# Patient Record
Sex: Male | Born: 1935 | Race: White | Hispanic: No | Marital: Married | Smoking: Former smoker
Health system: Southern US, Community
[De-identification: ages and names within clinical notes are randomized; demographics above are authoritative.]

## PROBLEM LIST (undated history)

## (undated) DIAGNOSIS — C801 Malignant (primary) neoplasm, unspecified: Secondary | ICD-10-CM

## (undated) DIAGNOSIS — I10 Essential (primary) hypertension: Secondary | ICD-10-CM

## (undated) HISTORY — PX: CATARACT EXTRACTION: SUR2

## (undated) HISTORY — PX: PROSTATE SURGERY: SHX751

## (undated) HISTORY — PX: RECONSTRUCTION OF EYELID: SHX6576

## (undated) HISTORY — PX: SKIN CANCER EXCISION: SHX779

---

## 2005-11-03 ENCOUNTER — Ambulatory Visit: Payer: Self-pay | Admitting: Specialist

## 2009-11-30 ENCOUNTER — Inpatient Hospital Stay: Payer: Self-pay | Admitting: Internal Medicine

## 2010-03-04 ENCOUNTER — Ambulatory Visit: Payer: Self-pay | Admitting: Internal Medicine

## 2010-03-24 ENCOUNTER — Ambulatory Visit: Payer: Self-pay | Admitting: Pain Medicine

## 2010-03-29 ENCOUNTER — Emergency Department: Payer: Self-pay | Admitting: Emergency Medicine

## 2010-04-06 ENCOUNTER — Ambulatory Visit: Payer: Self-pay | Admitting: Pain Medicine

## 2010-04-19 ENCOUNTER — Ambulatory Visit: Payer: Self-pay | Admitting: Pain Medicine

## 2010-04-22 ENCOUNTER — Ambulatory Visit: Payer: Self-pay | Admitting: Pain Medicine

## 2010-05-18 ENCOUNTER — Ambulatory Visit: Payer: Self-pay | Admitting: Pain Medicine

## 2010-05-31 ENCOUNTER — Ambulatory Visit: Payer: Self-pay | Admitting: Pain Medicine

## 2011-10-24 ENCOUNTER — Ambulatory Visit: Payer: Self-pay | Admitting: Urology

## 2012-01-30 IMAGING — US ABDOMEN ULTRASOUND
1 series · 17 of 25 positions shown · non-contrast
Comparison: none

REASON FOR EXAM: pain, epigastric - eval for biliary disease
COMMENTS:

PROCEDURE:     US  - US ABDOMEN GENERAL SURVEY  - March 29, 2010 [DATE]
RESULT:     Abdominal ultrasound dated 03/29/2010.

[Series 1: abdomen ultrasound · 17 of 110 slices shown]
[im 1/110]
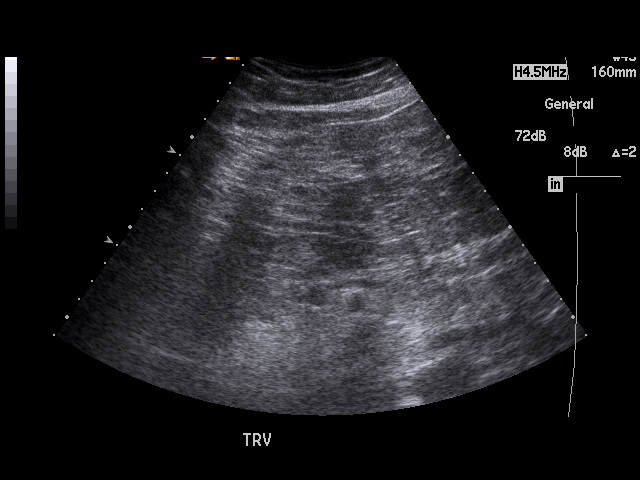
[im 10/110]
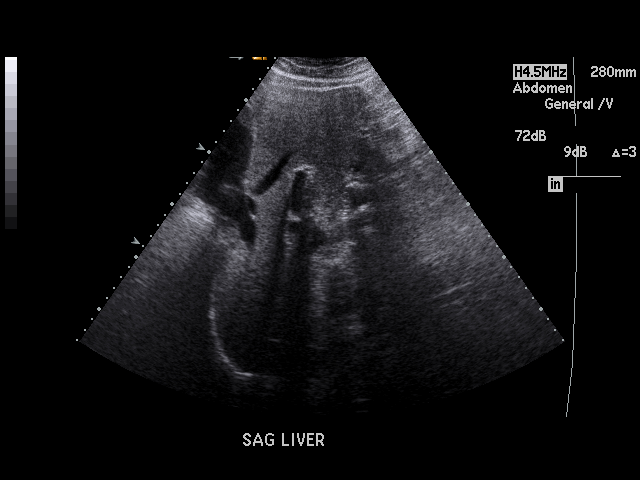
[im 14/110]
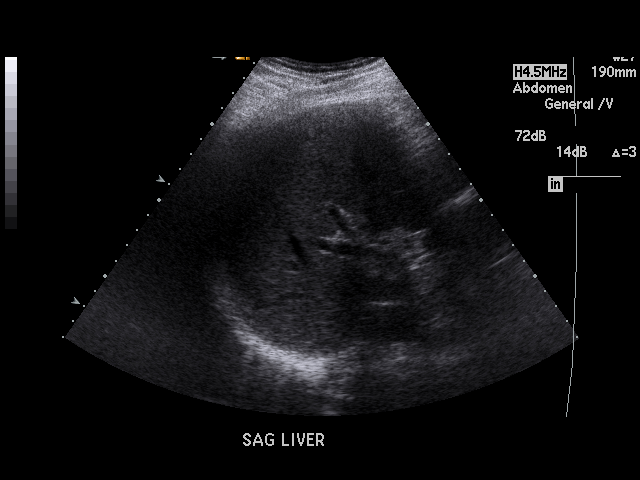
[im 23/110]
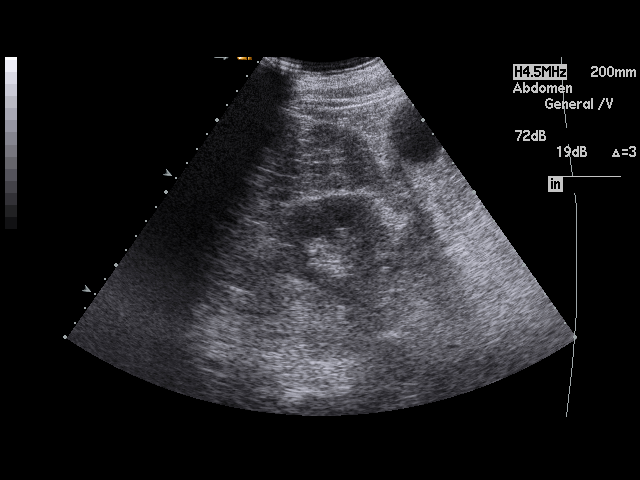
[im 28/110]
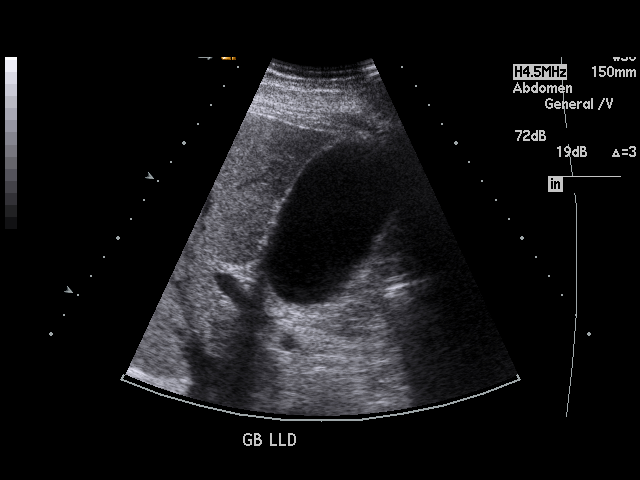
[im 37/110]
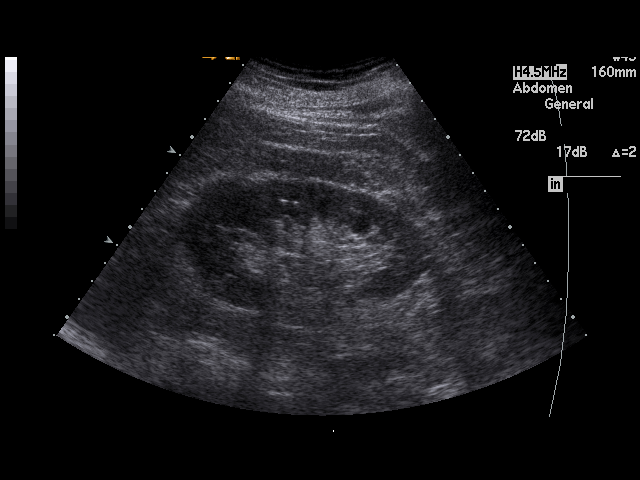
[im 41/110]
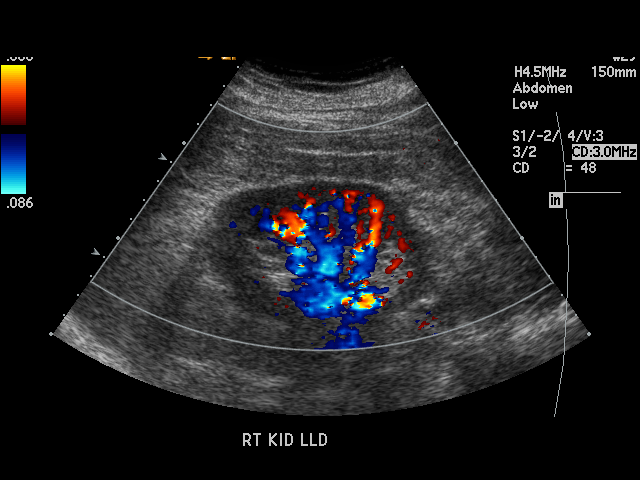
[im 50/110]
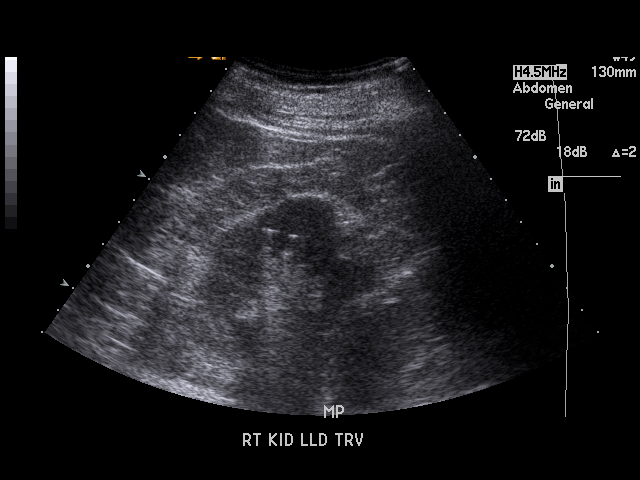
[im 55/110]
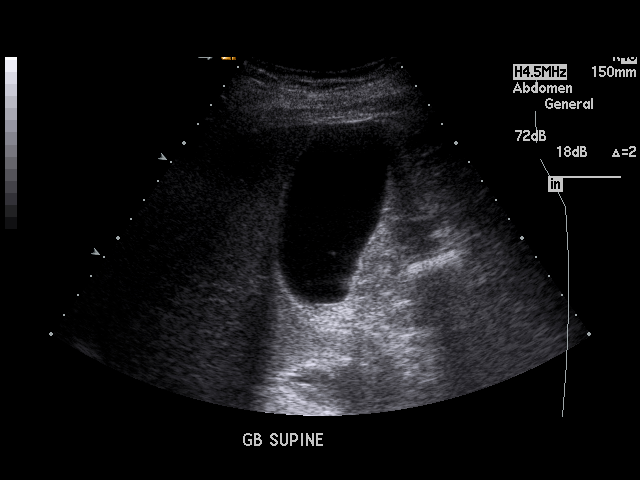
[im 60/110]
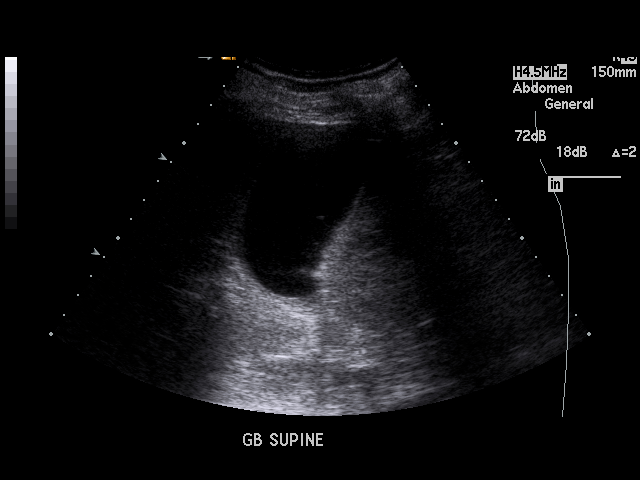
[im 69/110]
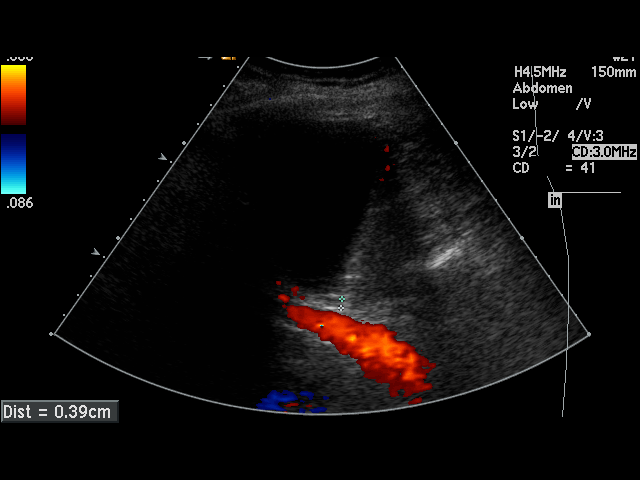
[im 73/110]
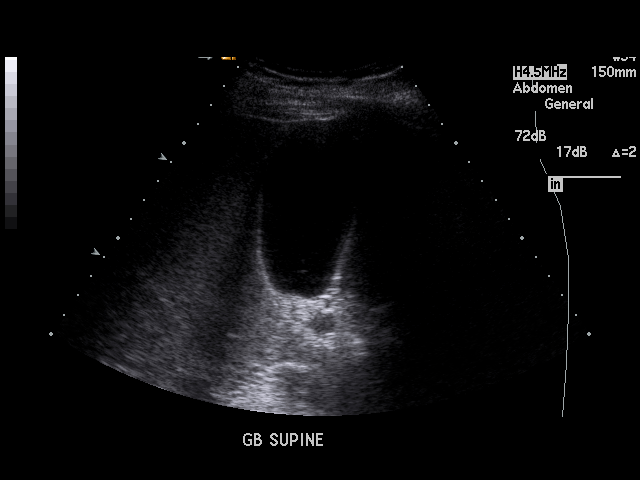
[im 82/110]
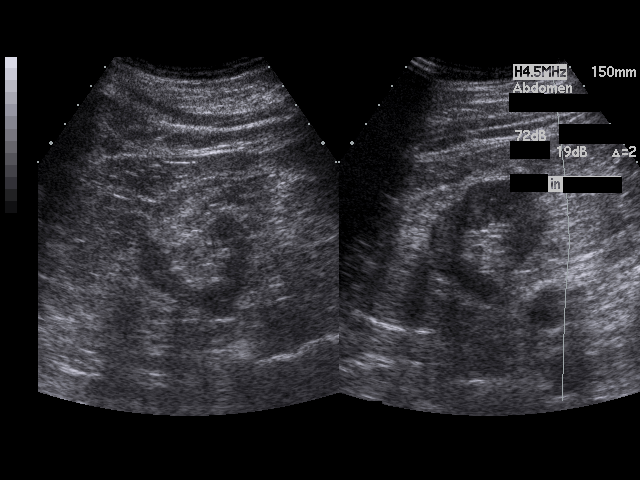
[im 87/110]
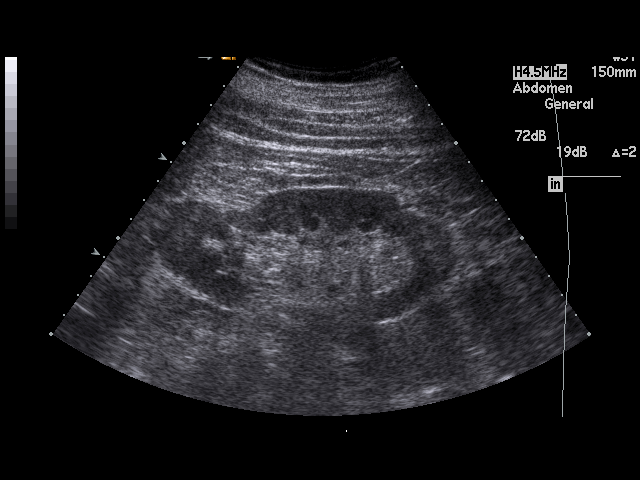
[im 96/110]
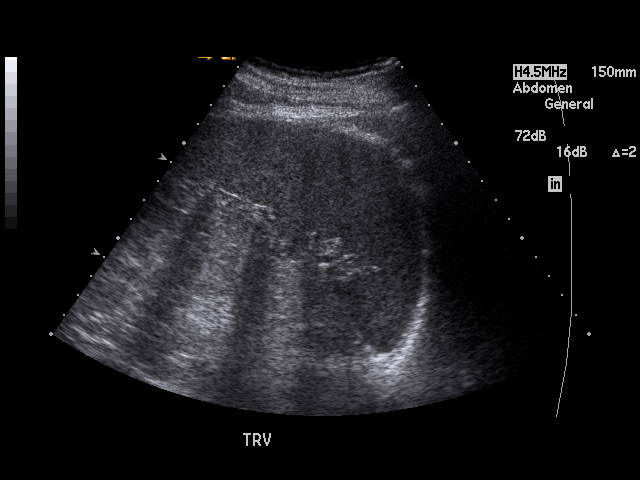
[im 100/110]
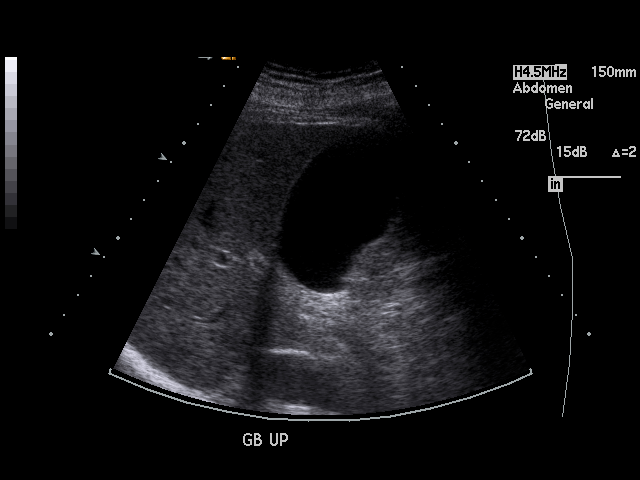
[im 110/110]
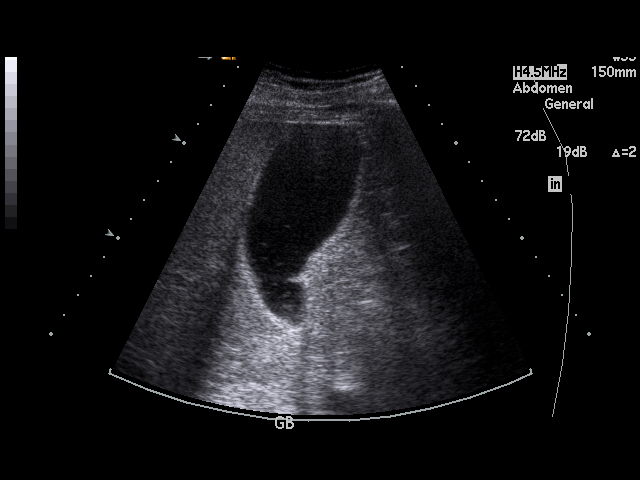

[17 of 25 positions shown; findings below may reference images not displayed]

FINDINGS: The liver demonstrates a homogeneous echotexture. Hepatopedal flow
is identified in the portal vein. Aorta and IVC are unremarkable. The
pancreas is not visualized. Evaluation of the gallbladder demonstrates
findings suspicious for small amount of dependent sludge in the gallbladder.
Otherwise is no evidence of a sonographic Murphy's sign or gallbladder wall
thickness nor pericholecystic fluid. Gallbladder wall measures 2.1 mm in
thickness the common bile that measures 4.3 mm in diameter. Evaluation of
the right kidney measures dimensions of 11.1 x 5.9 x 5.09 cm. There is
appropriate cortical medullary differentiation without evidence of
hydronephrosis masses or calculi. The left kidney measures 12.60 x 5.62 x
5.16 cm. A cortical defect is identified within the upper pole region of the
left kidney possibly representing the sequela of focal pyelonephritis
possibly a focal vascular insult. The spleen demonstrates a homogeneous
echotexture measures 13.3 cm in longitudinal dimensions.
IMPRESSION: Spleen within the upper limits of normal and a cortical
defect along the left kidney otherwise no further focal or acute
abnormalities.

## 2013-06-15 ENCOUNTER — Emergency Department: Payer: Self-pay | Admitting: Internal Medicine

## 2013-06-15 LAB — CBC
HCT: 41.6 % (ref 40.0–52.0)
HGB: 14.9 g/dL (ref 13.0–18.0)
MCH: 33 pg (ref 26.0–34.0)
RBC: 4.51 10*6/uL (ref 4.40–5.90)
WBC: 9.5 10*3/uL (ref 3.8–10.6)

## 2013-06-15 LAB — URINALYSIS, COMPLETE
Bacteria: NONE SEEN
Glucose,UR: NEGATIVE mg/dL (ref 0–75)
Ketone: NEGATIVE
Ph: 7 (ref 4.5–8.0)
Protein: NEGATIVE
Specific Gravity: 1.006 (ref 1.003–1.030)

## 2013-06-15 LAB — COMPREHENSIVE METABOLIC PANEL
Albumin: 3.3 g/dL — ABNORMAL LOW (ref 3.4–5.0)
Alkaline Phosphatase: 90 U/L (ref 50–136)
Anion Gap: 7 (ref 7–16)
BUN: 15 mg/dL (ref 7–18)
Chloride: 108 mmol/L — ABNORMAL HIGH (ref 98–107)
Co2: 25 mmol/L (ref 21–32)
Creatinine: 1.06 mg/dL (ref 0.60–1.30)
Glucose: 145 mg/dL — ABNORMAL HIGH (ref 65–99)
Osmolality: 283 (ref 275–301)
SGOT(AST): 16 U/L (ref 15–37)
SGPT (ALT): 18 U/L (ref 12–78)
Sodium: 140 mmol/L (ref 136–145)
Total Protein: 6.3 g/dL — ABNORMAL LOW (ref 6.4–8.2)

## 2013-07-09 ENCOUNTER — Ambulatory Visit: Payer: Self-pay | Admitting: Urology

## 2013-07-09 DIAGNOSIS — I2510 Atherosclerotic heart disease of native coronary artery without angina pectoris: Secondary | ICD-10-CM

## 2013-07-09 LAB — CBC WITH DIFFERENTIAL/PLATELET
Basophil #: 0 10*3/uL (ref 0.0–0.1)
Eosinophil #: 0.1 10*3/uL (ref 0.0–0.7)
HCT: 41.1 % (ref 40.0–52.0)
Lymphocyte #: 1.7 10*3/uL (ref 1.0–3.6)
Lymphocyte %: 20.8 %
MCHC: 34.2 g/dL (ref 32.0–36.0)
MCV: 92 fL (ref 80–100)
Monocyte #: 0.6 x10 3/mm (ref 0.2–1.0)
Neutrophil #: 5.5 10*3/uL (ref 1.4–6.5)
RBC: 4.46 10*6/uL (ref 4.40–5.90)
RDW: 13.2 % (ref 11.5–14.5)
WBC: 7.9 10*3/uL (ref 3.8–10.6)

## 2013-07-16 ENCOUNTER — Ambulatory Visit: Payer: Self-pay | Admitting: Urology

## 2013-07-17 LAB — CBC WITH DIFFERENTIAL/PLATELET
Basophil #: 0 10*3/uL (ref 0.0–0.1)
Basophil %: 0.2 %
Eosinophil #: 0.1 10*3/uL (ref 0.0–0.7)
Eosinophil %: 1.6 %
HCT: 36.8 % — ABNORMAL LOW (ref 40.0–52.0)
HGB: 12.7 g/dL — ABNORMAL LOW (ref 13.0–18.0)
MCH: 31.6 pg (ref 26.0–34.0)
MCHC: 34.4 g/dL (ref 32.0–36.0)
Monocyte #: 0.7 x10 3/mm (ref 0.2–1.0)
Monocyte %: 8.3 %
Neutrophil #: 6 10*3/uL (ref 1.4–6.5)
Neutrophil %: 74.4 %
RBC: 4.01 10*6/uL — ABNORMAL LOW (ref 4.40–5.90)
RDW: 13.2 % (ref 11.5–14.5)
WBC: 8.1 10*3/uL (ref 3.8–10.6)

## 2013-07-17 LAB — BASIC METABOLIC PANEL
Anion Gap: 4 — ABNORMAL LOW (ref 7–16)
BUN: 18 mg/dL (ref 7–18)
Calcium, Total: 8.5 mg/dL (ref 8.5–10.1)
Co2: 27 mmol/L (ref 21–32)
EGFR (African American): >60
Potassium: 3.9 mmol/L (ref 3.5–5.1)
Sodium: 140 mmol/L (ref 136–145)

## 2013-08-26 IMAGING — CR DG ABDOMEN 1V
1 series · 1 of 1 positions shown · non-contrast
Comparison: none

REASON FOR EXAM: nephrolihiasis Renal Colic
COMMENTS:

[supine ap]
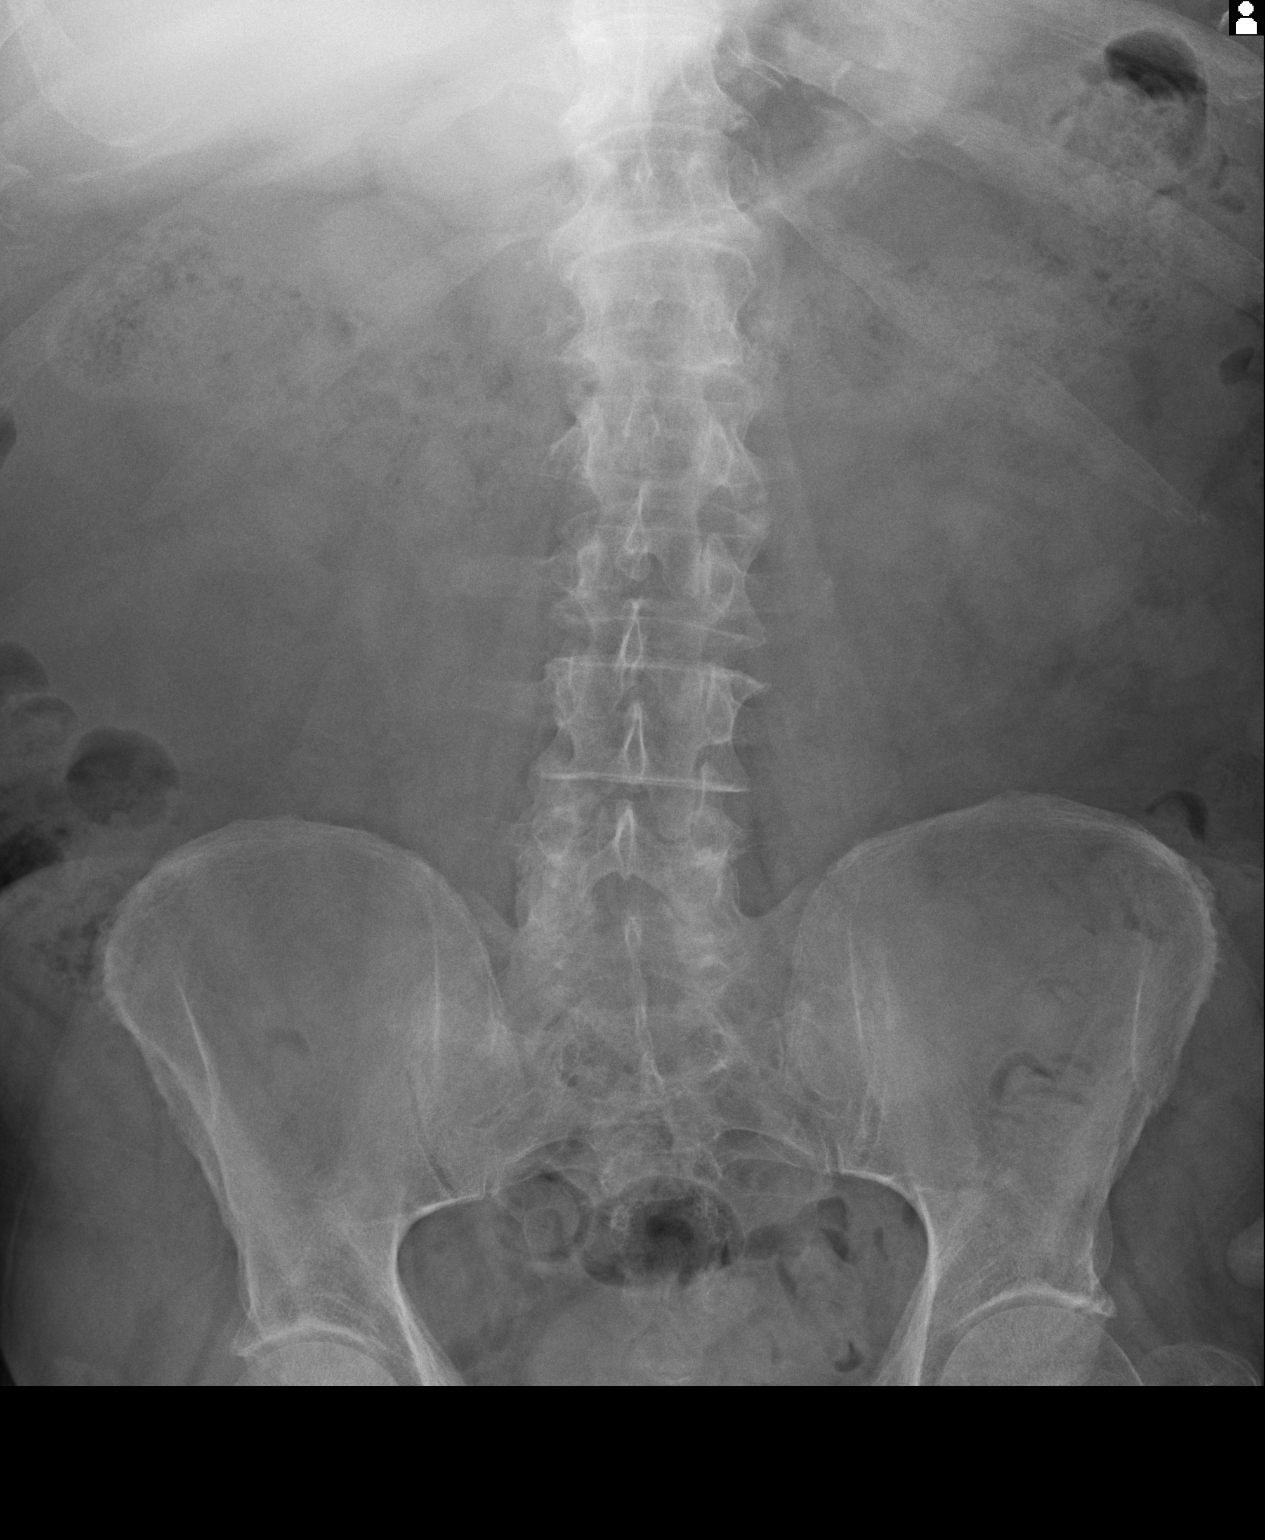

[1 of 1 positions shown; findings below may reference images not displayed]

PROCEDURE:     DXR - DXR KIDNEY URETER BLADDER  - October 24, 2011 [DATE]

RESULT:

Frontal view of the abdomen and pelvis is performed.

Air is seen within nondilated loops of large and small bowel. A moderate to
large amount of stool is appreciated within the colon. Evaluation of the
right and left renal fossa regions demonstrates no appreciable evidence of
calculi. The visualized bony skeleton demonstrates no evidence of fracture
or dislocation.
IMPRESSION: Nonobstructive bowel gas pattern with a moderate amount of
stool.

## 2014-04-10 DIAGNOSIS — I10 Essential (primary) hypertension: Secondary | ICD-10-CM | POA: Insufficient documentation

## 2014-04-10 DIAGNOSIS — F411 Generalized anxiety disorder: Secondary | ICD-10-CM | POA: Insufficient documentation

## 2014-04-10 DIAGNOSIS — D649 Anemia, unspecified: Secondary | ICD-10-CM | POA: Insufficient documentation

## 2014-12-12 NOTE — Op Note (Signed)
PATIENT NAME:  Don Arias, Don Arias MR#:  [id number] DATE OF BIRTH:  1929-09-19  DATE OF PROCEDURE:  07/16/2013  PREOPERATIVE DIAGNOSES: Benign prostatic hypertrophy with median lobe formation and urinary retention.   POSTOPERATIVE DIAGNOSES: Benign prostatic hypertrophy with median lobe formation and urinary retention.   PROCEDURE: Transurethral resection of the prostate.   SURGEON: Edrick Oh, M.D.   ANESTHESIA: Laryngeal mask airway anesthesia.   INDICATIONS: The patient is an 81 year old gentleman with a history of prostatic hypertrophy. He recently developed urinary retention, failing voiding trials and medical management. Cystoscopy demonstrated a prominent median lobe with intravesical protrusion and prominent bilobar hypertrophy. He presents for transurethral resection of the prostate.   DESCRIPTION OF PROCEDURE: After informed consent was obtained, the patient was taken to the operating room and placed in the dorsal lithotomy position under laryngeal mask airway anesthesia. The patient was then prepped and draped in the usual standard fashion. The saline resectoscope sheath was placed through the penis under direct vision with no significant abnormalities appreciated. Upon entering the prostatic fossa, prominent bilobar hypertrophy was noted. The verumontanum was easily identified. Complete visual obstruction was appreciated. A fairly high bladder neck was also appreciated with a prominent median lobe with intravesical protrusion. Bilateral ureteral orifices were easily identified. Some inflammatory changes were noted on the posterior bladder wall consistent with Foley catheterization. No other significant abnormalities were noted. The saline resectoscope was then placed through the sheath. Resection was initially begun at the level of the bladder neck at the 6 o'clock position back to the level of the verumontanum. The bulk of the median lobe tissue was not resected at this time. Once a channel  was developed, the bladder neck was taken down to the level of the trigone. This included a significant portion of the median lobe tissue. Additional resection was then undertaken bilaterally of the remaining median lobe tissue. Additional lateral lobe tissue was taken down circumferentially with some noted anteriorly. This was also taken down utilizing the loop. Additional lateral lobe tissue was then taken down back to the level of the verumontanum. Once again, some anterior tissue was present even at this level. This was also resected. Once the bulk of the resection was complete, the button electrode was then utilized to facilitate additional tissue removal. There were several areas of additional tissue noted predominantly on the left. The loop was replaced. Additional tissue was resected as well as some apical tissue that was once again appreciated. Once the residual tissue was resected, the button electrode was once again utilized to facilitate surfacing of the prostate with complete fulguration of any significant bleeders. A wide open bladder neck was appreciated. Bilateral ureteral orifices were easily appreciated at the end of the procedure with no abnormalities noted. The prostate chips were irrigated free from the bladder. They were collected and will be sent for pathology analysis. The resectoscope was removed. A 22-French 3-way Foley catheter was placed utilizing the catheter guide without difficulty. The catheter was placed to gravity drainage. It was also placed to continuous bladder irrigation. The patient was returned to the supine position. He was awakened from laryngeal mask airway anesthesia. Was taken to the recovery room in stable condition. There were no problems or complications. The patient tolerated the procedure well.   ESTIMATED BLOOD LOSS: Minimal.   ____________________________ Denice Bors. Jacqlyn Larsen, MD bsc:gb D: 07/17/2013 22:15:48 ET T: 07/17/2013 22:57:21 ET JOB#: [id number]  cc: Denice Bors. Jacqlyn Larsen, MD, <Dictator> Denice Bors  MD ELECTRONICALLY SIGNED 07/22/2013 9:14

## 2014-12-12 NOTE — Discharge Summary (Signed)
PATIENT NAME:  Don Arias, Don Arias MR#:  [id number] DATE OF BIRTH:  November 02, 1929  DATE OF ADMISSION:  07/16/2013 DATE OF DISCHARGE:  07/17/2013  PRINCIPAL DIAGNOSES: Benign prostatic hypertrophy with median lobe formation, urinary retention.   PROCEDURE: Transurethral resection of the prostate.   INDICATIONS: The patient is an 81 year old gentleman with a history of prostatic hypertrophy. He recently developed urinary retention, failing voiding trials and medical management. Cystoscopy demonstrated prominent bilobar prostatic hypertrophy with prominent median lobe formation. He presents for transurethral resection of the prostate.   HOSPITAL COURSE: Don Arias was taken to the operating room on 07/16/2013, at which time he underwent transurethral resection of the prostate utilizing the saline resectoscope. A significant amount of tissue was resected. No significant bleeding was encountered. The patient's postoperative course was without significant problems or complications. He was maintained on continuous bladder irrigation until the morning of postoperative day 1. The irrigation remained only blood-tinged throughout his hospitalization. No significant clots were encountered. The patient had minimal pain and discomfort. There was no significant nausea or vomiting. He was tolerating a general diet without difficulty. He remained afebrile, vital signs stable. Blood work the morning postprocedure demonstrated no significant hemoglobin drop with good renal function. Magnesium levels were also within acceptable limits. The Foley catheter and continuous bladder irrigation was removed on the early morning of postoperative day 1. The patient was noted to void 3 times. Prior to discharge, his residual was minimal at approximately 18 mL, only mild blood-tinged urine was appreciated. The patient continued to do well and was ambulating without difficulty. He was subsequently discharged to home on 07/17/2013, with  instructions to follow up as scheduled. He was discharged on his usual admission medications, in addition to Cipro 500 mg twice daily for a 7 day course and hydrocodone 5/325 mg 1 every 6 hours as needed for pain. He is to avoid any nonsteroidal pain medications or significant physical activity for at least the next 2 weeks. He is to notify us if there are any further problems or questions in the interim.     ____________________________ Denice Bors Jacqlyn Larsen, MD bsc:dmm D: 07/17/2013 22:18:41 ET T: 07/17/2013 22:55:34 ET JOB#: [id number]  cc: Denice Bors. Jacqlyn Larsen, MD, <Dictator> Denice Bors  MD ELECTRONICALLY SIGNED 07/22/2013 9:14

## 2021-04-16 ENCOUNTER — Encounter: Payer: Self-pay | Admitting: Emergency Medicine

## 2021-04-16 ENCOUNTER — Other Ambulatory Visit: Payer: Self-pay

## 2021-04-16 ENCOUNTER — Emergency Department
Admission: EM | Admit: 2021-04-16 | Discharge: 2021-04-16 | Disposition: A | Discharge status: Home / Self Care | Payer: Medicare Other | Attending: Emergency Medicine | Admitting: Emergency Medicine

## 2021-04-16 DIAGNOSIS — K219 Gastro-esophageal reflux disease without esophagitis: Secondary | ICD-10-CM

## 2021-04-16 DIAGNOSIS — F29 Unspecified psychosis not due to a substance or known physiological condition: Secondary | ICD-10-CM | POA: Insufficient documentation

## 2021-04-16 DIAGNOSIS — F4321 Adjustment disorder with depressed mood: Secondary | ICD-10-CM

## 2021-04-16 DIAGNOSIS — F4320 Adjustment disorder, unspecified: Secondary | ICD-10-CM | POA: Insufficient documentation

## 2021-04-16 DIAGNOSIS — F410 Panic disorder [episodic paroxysmal anxiety] without agoraphobia: Secondary | ICD-10-CM | POA: Diagnosis not present

## 2021-04-16 DIAGNOSIS — I10 Essential (primary) hypertension: Secondary | ICD-10-CM | POA: Insufficient documentation

## 2021-04-16 DIAGNOSIS — Y900 Blood alcohol level of less than 20 mg/100 ml: Secondary | ICD-10-CM | POA: Insufficient documentation

## 2021-04-16 DIAGNOSIS — Z85828 Personal history of other malignant neoplasm of skin: Secondary | ICD-10-CM | POA: Insufficient documentation

## 2021-04-16 DIAGNOSIS — Z87891 Personal history of nicotine dependence: Secondary | ICD-10-CM | POA: Insufficient documentation

## 2021-04-16 DIAGNOSIS — Z79899 Other long term (current) drug therapy: Secondary | ICD-10-CM | POA: Insufficient documentation

## 2021-04-16 HISTORY — DX: Essential (primary) hypertension: I10

## 2021-04-16 HISTORY — DX: Malignant (primary) neoplasm, unspecified: C80.1

## 2021-04-16 LAB — COMPREHENSIVE METABOLIC PANEL
ALT: 16 U/L (ref 0–44)
AST: 20 U/L (ref 15–41)
Albumin: 3.8 g/dL (ref 3.5–5.0)
Alkaline Phosphatase: 65 U/L (ref 38–126)
Anion gap: 5 (ref 5–15)
BUN: 23 mg/dL (ref 8–23)
CO2: 27 mmol/L (ref 22–32)
Calcium: 9.3 mg/dL (ref 8.9–10.3)
Chloride: 108 mmol/L (ref 98–111)
Creatinine, Ser: 0.93 mg/dL (ref 0.61–1.24)
GFR, Estimated: >60 mL/min (ref >60)
Glucose, Bld: 111 mg/dL — ABNORMAL HIGH (ref 70–99)
Potassium: 3.8 mmol/L (ref 3.5–5.1)
Sodium: 140 mmol/L (ref 135–145)
Total Bilirubin: 0.7 mg/dL (ref 0.3–1.2)
Total Protein: 6.4 g/dL — ABNORMAL LOW (ref 6.5–8.1)

## 2021-04-16 LAB — CBC
HCT: 44.2 % (ref 39.0–52.0)
Hemoglobin: 14.8 g/dL (ref 13.0–17.0)
MCH: 32.8 pg (ref 26.0–34.0)
MCHC: 33.5 g/dL (ref 30.0–36.0)
MCV: 98 fL (ref 80.0–100.0)
Platelets: 183 10*3/uL (ref 150–400)
RBC: 4.51 MIL/uL (ref 4.22–5.81)
RDW: 12.8 % (ref 11.5–15.5)
WBC: 7.5 10*3/uL (ref 4.0–10.5)
nRBC: 0 % (ref 0.0–0.2)

## 2021-04-16 LAB — ETHANOL: Alcohol, Ethyl (B): <10 mg/dL (ref <10)

## 2021-04-16 MED ORDER — FLUOXETINE HCL 20 MG PO CAPS: 20.0000 mg | ORAL_CAPSULE | Freq: Every day | ORAL | 1 refills | Status: DC

## 2021-04-16 MED ORDER — FLUOXETINE HCL 20 MG PO CAPS: 20.0000 mg | ORAL_CAPSULE | Freq: Every day | ORAL | Status: DC

## 2021-04-16 MED ORDER — CLONAZEPAM 0.5 MG PO TABS: 0.5000 mg | ORAL_TABLET | Freq: Every day | ORAL | 0 refills | Status: DC

## 2021-04-16 MED ORDER — PANTOPRAZOLE SODIUM 40 MG PO TBEC: 40.0000 mg | DELAYED_RELEASE_TABLET | Freq: Every day | ORAL | 1 refills | Status: DC

## 2021-04-16 MED ORDER — CLONAZEPAM 0.5 MG PO TABS: 0.5000 mg | ORAL_TABLET | Freq: Every day | ORAL | Status: DC

## 2021-04-16 MED ORDER — PANTOPRAZOLE SODIUM 40 MG PO TBEC: 40.0000 mg | DELAYED_RELEASE_TABLET | Freq: Every day | ORAL | Status: DC

## 2021-04-16 NOTE — ED Triage Notes (Signed)
Pt comes into the ED via POV with his daughter c/o suicidal ideation and need for psych consult.  Pt states that he lost his wife about a month ago and since then he has been having increased stress and anxiety.  PT states about 2 days ago he thought he had a panic attack and since then he has made comments to his children that he just wanted to end things.  Pt did inform children that he had nothing in the home to complete any suicidal thoughts.  Pt currently is denying SI or HI.  Pt denies any history of depression either.

## 2021-04-16 NOTE — ED Notes (Signed)
Pt given lunch tray.

## 2021-04-16 NOTE — Consult Note (Signed)
[facility] Face-to-Face Psychiatry Consult   Reason for Consult: Consult for 87 year old man who came voluntarily to the emergency room because of new onset anxiety and dysphoria Referring Physician: Corky Downs Patient Identification: Don Arias MRN:  [id number] Principal Diagnosis: Panic disorder Diagnosis:  Principal Problem:   Panic disorder Active Problems:   GERD (gastroesophageal reflux disease)   Grief   Total Time spent with patient: 1 hour  Subjective:   Don Arias is a 87 y.o. male patient admitted with "my nights have been bad".  HPI: Patient seen chart reviewed.  87 year old man came to the hospital accompanied by his daughter.  Patient's wife died several weeks ago.  In the last couple weeks he has developed anxiety symptoms that have become very severe at nighttime primarily.  He reports that during the night he will have spells where his heart will pound, he feels short of breath, he will feel like his stomach is tying in knots and have severe heartburn symptoms and at times will feel so overwhelmed that he thinks he is going to die.  The daytime is better although he has been anxious and dysphoric during the day.  Has called his children several times to talk about how bad the symptoms are.  On one occasion his children were under the impression that he was talking about having suicidal thoughts.  In interview today the patient tells me that he was being completely hypothetical and at no time actually had any intention or plan of harming himself.  He is still getting up and taking care of himself during the day.  Eating adequately.  Interacting with family.  No evidence of psychotic symptoms.  Patient does not drink at all or use any drugs and is not on any prescription medicine.  No evidence of any significant dementia  Past Psychiatric History: Patient reports a spell some years ago when he was briefly prescribed Xanax by his primary care doctor.  He says he took this for a  short period of time and tapered off of it.  Found it helpful but was easily able to stop it.  No other psychiatric medicine known.  No history of suicide attempts or violence.  Risk to Self:   Risk to Others:   Prior Inpatient Therapy:   Prior Outpatient Therapy:    Past Medical History:  Past Medical History:  Diagnosis Date   Cancer ([place])    skin   Hypertension     Past Surgical History:  Procedure Laterality Date   CATARACT EXTRACTION     PROSTATE SURGERY     RECONSTRUCTION OF EYELID     SKIN CANCER EXCISION     Family History: History reviewed. No pertinent family history. Family Psychiatric  History: None reported Social History:  Social History   Substance and Sexual Activity  Alcohol Use Not Currently     Social History   Substance and Sexual Activity  Drug Use Never    Social History   Socioeconomic History   Marital status: Married    Spouse name: Not on file   Number of children: Not on file   Years of education: Not on file   Highest education level: Not on file  Occupational History   Not on file  Tobacco Use   Smoking status: Former    Types: Cigarettes   Smokeless tobacco: Former  Substance and Sexual Activity   Alcohol use: Not Currently   Drug use: Never   Sexual activity: Not on file  Other Topics Concern   Not on file  Social History Narrative   Not on file   Social Determinants of Health   Financial Resource Strain: Not on file  Food Insecurity: Not on file  Transportation Needs: Not on file  Physical Activity: Not on file  Stress: Not on file  Social Connections: Not on file   Additional Social History:    Allergies:   Allergies  Allergen Reactions   Nitroglycerin Other (See Comments)    Cardiac arrest    Labs:  Results for orders placed or performed during the hospital encounter of 04/16/21 (from the past 48 hour(s))  Comprehensive metabolic panel     Status: Abnormal   Collection Time: 04/16/21 11:41 AM  Result  Value Ref Range   Sodium 140 135 - 145 mmol/L   Potassium 3.8 3.5 - 5.1 mmol/L   Chloride 108 98 - 111 mmol/L   CO2 27 22 - 32 mmol/L   Glucose, Bld 111 (H) 70 - 99 mg/dL    Comment: Glucose reference range applies only to samples taken after fasting for at least 8 hours.   BUN 23 8 - 23 mg/dL   Creatinine, Ser 0.93 0.61 - 1.24 mg/dL   Calcium 9.3 8.9 - 10.3 mg/dL   Total Protein 6.4 (L) 6.5 - 8.1 g/dL   Albumin 3.8 3.5 - 5.0 g/dL   AST 20 15 - 41 U/L   ALT 16 0 - 44 U/L   Alkaline Phosphatase 65 38 - 126 U/L   Total Bilirubin 0.7 0.3 - 1.2 mg/dL   GFR, Estimated >60 >60 mL/min    Comment: (NOTE) Calculated using the CKD-EPI Creatinine Equation (2021)    Anion gap 5 5 - 15    Comment: Performed at [facility], [address] [place], [place] [address]  Ethanol     Status: None   Collection Time: 04/16/21 11:41 AM  Result Value Ref Range   Alcohol, Ethyl (B) <10 <10 mg/dL    Comment: (NOTE) Lowest detectable limit for serum alcohol is 10 mg/dL.  For medical purposes only. Performed at [facility], [address] [place], [place] [address]   cbc     Status: None   Collection Time: 04/16/21 11:41 AM  Result Value Ref Range   WBC 7.5 4.0 - 10.5 K/uL   RBC 4.51 4.22 - 5.81 MIL/uL   Hemoglobin 14.8 13.0 - 17.0 g/dL   HCT 44.2 39.0 - 52.0 %   MCV 98.0 80.0 - 100.0 fL   MCH 32.8 26.0 - 34.0 pg   MCHC 33.5 30.0 - 36.0 g/dL   RDW 12.8 11.5 - 15.5 %   Platelets 183 150 - 400 K/uL   nRBC 0.0 0.0 - 0.2 %    Comment: Performed at [facility], [address] [place], [place] [address]    Current Facility-Administered Medications  Medication Dose Route Frequency Provider Last Rate Last Admin   clonazePAM (KLONOPIN) tablet 0.5 mg  0.5 mg Oral QHS , Madie Reno, MD       Derrill Memo ON 04/17/2021] FLUoxetine (PROZAC) capsule 20 mg  20 mg Oral Daily , Madie Reno, MD       Derrill Memo ON 04/17/2021] pantoprazole (PROTONIX) EC tablet 40 mg  40 mg Oral  Daily , Madie Reno, MD       Current Outpatient Medications  Medication Sig Dispense Refill   clonazePAM (KLONOPIN) 0.5 MG tablet Take 1 tablet (0.5 mg total) by mouth at bedtime.  30 tablet 0   [START ON 04/17/2021] FLUoxetine (PROZAC) 20 MG capsule Take 1 capsule (20 mg total) by mouth daily. 30 capsule 1   [START ON 04/17/2021] pantoprazole (PROTONIX) 40 MG tablet Take 1 tablet (40 mg total) by mouth daily. 30 tablet 1    Musculoskeletal: Strength & Muscle Tone: within normal limits Gait & Station: normal Patient leans: N/A            Psychiatric Specialty Exam:  Presentation  General Appearance:  No data recorded Eye Contact: No data recorded Speech: No data recorded Speech Volume: No data recorded Handedness: No data recorded  Mood and Affect  Mood: No data recorded Affect: No data recorded  Thought Process  Thought Processes: No data recorded Descriptions of Associations:No data recorded Orientation:No data recorded Thought Content:No data recorded History of Schizophrenia/Schizoaffective disorder:No data recorded Duration of Psychotic Symptoms:No data recorded Hallucinations:No data recorded Ideas of Reference:No data recorded Suicidal Thoughts:No data recorded Homicidal Thoughts:No data recorded  Sensorium  Memory: No data recorded Judgment: No data recorded Insight: No data recorded  Executive Functions  Concentration: No data recorded Attention Span: No data recorded Recall: No data recorded Fund of Knowledge: No data recorded Language: No data recorded  Psychomotor Activity  Psychomotor Activity: No data recorded  Assets  Assets: No data recorded  Sleep  Sleep: No data recorded  Physical Exam: Physical Exam Vitals and nursing note reviewed.  Constitutional:      Appearance: Normal appearance.  HENT:     Head: Normocephalic and atraumatic.     Mouth/Throat:     Pharynx: Oropharynx is clear.  Eyes:     Pupils:  Pupils are equal, round, and reactive to light.  Cardiovascular:     Rate and Rhythm: Normal rate and regular rhythm.  Pulmonary:     Effort: Pulmonary effort is normal.     Breath sounds: Normal breath sounds.  Abdominal:     General: Abdomen is flat.     Palpations: Abdomen is soft.  Musculoskeletal:        General: Normal range of motion.  Skin:    General: Skin is warm and dry.  Neurological:     General: No focal deficit present.     Mental Status: He is alert. Mental status is at baseline.  Psychiatric:        Attention and Perception: Attention normal.        Mood and Affect: Mood is anxious.        Speech: Speech normal.        Behavior: Behavior normal.        Thought Content: Thought content normal.        Cognition and Memory: Cognition normal.        Judgment: Judgment normal.   Review of Systems  Constitutional: Negative.   HENT: Negative.    Eyes: Negative.   Respiratory: Negative.    Cardiovascular: Negative.   Gastrointestinal:  Positive for heartburn.  Musculoskeletal: Negative.   Skin: Negative.   Neurological: Negative.   Psychiatric/Behavioral:  Negative for hallucinations, substance abuse and suicidal ideas. The patient is nervous/anxious and has insomnia.   Blood pressure (!) 183/103, pulse 71, temperature 98 F (36.7 C), temperature source Oral, resp. rate 19, height '5\' 9"'$  (1.753 m), weight 83.9 kg, SpO2 98 %. Body mass index is 27.32 kg/m.  Treatment Plan Summary: Medication management and Plan 87 year old gentleman who presents with new onset panic symptoms occurring primarily at night disrupting his sleep.  Sadness  and dysphoria during the day perhaps somewhat more than would be expected for this time of grief but not necessarily so.  Does not appear to have a full complement of major depression symptoms.  Patient seems to have pretty significant gastric reflux symptoms even at baseline and those symptoms are one of the worst aspects of his current  panic attacks.  Psychoeducation completed about the treatment of this illness.  Patient encouraged to follow through with treatment.  Recommend starting fluoxetine 20 mg/day.  Normal full-strength adult dose despite his age as the patient seems to be in very good health.  Explained that this will take some time to work.  Meantime clonazepam 0.5 mg at bedtime for the anxiety symptoms.  Only 1 month given.  Acknowledged to the patient that it can be habit forming but encouraged him to take it for now and follow up with his primary provider.  Also pantoprazole extended release 40 mg a day for the gastric reflux.  Patient is a English as a second language teacher and has been instructed to follow-up with his primary care doctor there and seek referral to mental health at the [place].  Case reviewed with emergency room physician.  Patient and daughter are satisfied.  Patient does not appear to require inpatient hospitalization or commitment.  Disposition: No evidence of imminent risk to self or others at present.   Patient does not meet criteria for psychiatric inpatient admission. Supportive therapy provided about ongoing stressors. Discussed crisis plan, support from social network, calling 911, coming to the Emergency Department, and calling Suicide Hotline.  Alethia Berthold, MD 04/16/2021 2:57 PM

## 2021-04-16 NOTE — ED Notes (Signed)
Pt BP 190/110. Repeat BP 205/80. MD made aware

## 2021-04-16 NOTE — ED Notes (Signed)
Dr. Weber Cooks speaking with pt and daughter

## 2021-04-16 NOTE — ED Notes (Signed)
Urine sample collected and sent to lab by this Probation officer.

## 2021-04-16 NOTE — ED Notes (Signed)
Pts daughter at bedside  

## 2021-04-16 NOTE — ED Provider Notes (Signed)
[facility] Emergency Department Provider Note   ____________________________________________    I have reviewed the triage vital signs and the nursing notes.   HISTORY  Chief Complaint Psychiatric Evaluation     HPI Don Arias is a 87 y.o. male who presents for depression.  Patient reports his wife died recently and he is struggling with depression.  Daughter reports that patient has made suicidal comments.  He has nothing at his house that he can harm himself with reportedly but they note that he becomes very anxious and has difficulty sleeping and are requesting help with this.  Patient has no physical complaints at this time  Past Medical History:  Diagnosis Date   Cancer ([place])    skin   Hypertension     There are no problems to display for this patient.   Past Surgical History:  Procedure Laterality Date   CATARACT EXTRACTION     PROSTATE SURGERY     RECONSTRUCTION OF EYELID     SKIN CANCER EXCISION      Prior to Admission medications   Not on File     Allergies Nitroglycerin  History reviewed. No pertinent family history.  Social History Social History   Tobacco Use   Smoking status: Former    Types: Cigarettes   Smokeless tobacco: Former  Substance Use Topics   Alcohol use: Not Currently   Drug use: Never    Review of Systems  Constitutional: No fever/chills Eyes: No visual changes.  ENT: No sore throat. Cardiovascular: Denies chest pain. Respiratory: Denies shortness of breath. Gastrointestinal: No abdominal pain.   Genitourinary: Negative for dysuria. Musculoskeletal: Negative for back pain. Skin: Negative for rash. Neurological: Negative for headaches    ____________________________________________   PHYSICAL EXAM:  VITAL SIGNS: ED Triage Vitals  Enc Vitals Group     BP 04/16/21 1133 (!) 183/103     Pulse Rate 04/16/21 1133 71     Resp 04/16/21 1133 19     Temp 04/16/21 1133 98 F (36.7  C)     Temp Source 04/16/21 1133 Oral     SpO2 04/16/21 1133 98 %     Weight 04/16/21 1134 83.9 kg (185 lb)     Height 04/16/21 1134 1.753 m ('5\' 9"'$ )     Head Circumference --      Peak Flow --      Pain Score 04/16/21 1134 2     Pain Loc --      Pain Edu? --      Excl. in [place]? --     Constitutional: Alert and oriented.  Eyes: Conjunctivae are normal.   Nose: No congestion/rhinnorhea. Mouth/Throat: Mucous membranes are moist.   Neck:  Painless ROM Cardiovascular: Normal rate, regular rhythm.  Good peripheral circulation. Respiratory: Normal respiratory effort.  No retractions.   Genitourinary: deferred Musculoskeletal: No lower extremity tenderness nor edema.  Warm and well perfused Neurologic:  Normal speech and language. No gross focal neurologic deficits are appreciated.  Skin:  Skin is warm, dry and intact.  Psychiatric: Stable mood, tearful at times when describing his wife speech normal  ____________________________________________   LABS (all labs ordered are listed, but only abnormal results are displayed)  Labs Reviewed  ETHANOL  CBC  COMPREHENSIVE METABOLIC PANEL   ____________________________________________  EKG  None ____________________________________________  RADIOLOGY  None ____________________________________________   PROCEDURES  Procedure(s) performed: No  Procedures   Critical Care performed: No ____________________________________________   INITIAL IMPRESSION / ASSESSMENT AND  PLAN / ED COURSE  Pertinent labs & imaging results that were available during my care of the patient were reviewed by me and considered in my medical decision making (see chart for details).   Patient well-appearing and in no acute distress.  Have consulted psychiatry, patient will be seen by Dr. Weber Cooks ----------------------------------------- 2:48 PM on 04/16/2021 -----------------------------------------  Seen by Dr. Weber Cooks and cleared for  discharge    ____________________________________________   FINAL CLINICAL IMPRESSION(S) / ED DIAGNOSES  Final diagnoses:  Grief reaction        Note:  This document was prepared using Dragon voice recognition software and may include unintentional dictation errors.    Lavonia Drafts, MD 04/16/21 [phone]

## 2021-04-16 NOTE — ED Notes (Signed)
Pt given graham crackers.

## 2021-04-16 NOTE — ED Notes (Addendum)
Pt belongings:  Nyoka Cowden shirt [employer] ring (unable to get off patient at this time) [employer] boxers

## 2023-03-18 ENCOUNTER — Emergency Department: Payer: Medicare Other

## 2023-03-18 ENCOUNTER — Inpatient Hospital Stay
Admission: EM | Admit: 2023-03-18 | Discharge: 2023-04-04 | DRG: 329 | Disposition: A | Discharge status: Skilled Nursing Facility | Payer: Medicare Other | Attending: Surgery | Admitting: Surgery

## 2023-03-18 ENCOUNTER — Other Ambulatory Visit: Payer: Self-pay

## 2023-03-18 ENCOUNTER — Inpatient Hospital Stay: Payer: Medicare Other | Admitting: Certified Registered"

## 2023-03-18 ENCOUNTER — Encounter: Payer: Self-pay | Admitting: Emergency Medicine

## 2023-03-18 ENCOUNTER — Encounter
Admission: EM | Disposition: A | Discharge status: Skilled Nursing Facility | Payer: Self-pay | Source: Home / Self Care | Attending: Surgery

## 2023-03-18 DIAGNOSIS — Y848 Other medical procedures as the cause of abnormal reaction of the patient, or of later complication, without mention of misadventure at the time of the procedure: Secondary | ICD-10-CM | POA: Diagnosis not present

## 2023-03-18 DIAGNOSIS — Z888 Allergy status to other drugs, medicaments and biological substances status: Secondary | ICD-10-CM

## 2023-03-18 DIAGNOSIS — E876 Hypokalemia: Secondary | ICD-10-CM | POA: Diagnosis present

## 2023-03-18 DIAGNOSIS — I10 Essential (primary) hypertension: Secondary | ICD-10-CM | POA: Diagnosis present

## 2023-03-18 DIAGNOSIS — K567 Ileus, unspecified: Secondary | ICD-10-CM | POA: Diagnosis not present

## 2023-03-18 DIAGNOSIS — R160 Hepatomegaly, not elsewhere classified: Secondary | ICD-10-CM | POA: Diagnosis present

## 2023-03-18 DIAGNOSIS — Z87891 Personal history of nicotine dependence: Secondary | ICD-10-CM

## 2023-03-18 DIAGNOSIS — K631 Perforation of intestine (nontraumatic): Principal | ICD-10-CM | POA: Diagnosis present

## 2023-03-18 DIAGNOSIS — K668 Other specified disorders of peritoneum: Secondary | ICD-10-CM | POA: Diagnosis not present

## 2023-03-18 DIAGNOSIS — I82621 Acute embolism and thrombosis of deep veins of right upper extremity: Secondary | ICD-10-CM | POA: Diagnosis not present

## 2023-03-18 DIAGNOSIS — E860 Dehydration: Secondary | ICD-10-CM | POA: Diagnosis present

## 2023-03-18 DIAGNOSIS — Z85828 Personal history of other malignant neoplasm of skin: Secondary | ICD-10-CM | POA: Diagnosis not present

## 2023-03-18 DIAGNOSIS — Z79899 Other long term (current) drug therapy: Secondary | ICD-10-CM

## 2023-03-18 DIAGNOSIS — K56609 Unspecified intestinal obstruction, unspecified as to partial versus complete obstruction: Secondary | ICD-10-CM | POA: Diagnosis present

## 2023-03-18 DIAGNOSIS — R339 Retention of urine, unspecified: Secondary | ICD-10-CM | POA: Diagnosis not present

## 2023-03-18 DIAGNOSIS — Z66 Do not resuscitate: Secondary | ICD-10-CM | POA: Diagnosis present

## 2023-03-18 DIAGNOSIS — C772 Secondary and unspecified malignant neoplasm of intra-abdominal lymph nodes: Secondary | ICD-10-CM | POA: Diagnosis present

## 2023-03-18 DIAGNOSIS — Y718 Miscellaneous cardiovascular devices associated with adverse incidents, not elsewhere classified: Secondary | ICD-10-CM | POA: Diagnosis not present

## 2023-03-18 DIAGNOSIS — R109 Unspecified abdominal pain: Secondary | ICD-10-CM | POA: Diagnosis present

## 2023-03-18 DIAGNOSIS — R319 Hematuria, unspecified: Secondary | ICD-10-CM | POA: Diagnosis not present

## 2023-03-18 DIAGNOSIS — T82868A Thrombosis of vascular prosthetic devices, implants and grafts, initial encounter: Secondary | ICD-10-CM | POA: Diagnosis not present

## 2023-03-18 DIAGNOSIS — C189 Malignant neoplasm of colon, unspecified: Secondary | ICD-10-CM | POA: Diagnosis present

## 2023-03-18 DIAGNOSIS — C787 Secondary malignant neoplasm of liver and intrahepatic bile duct: Secondary | ICD-10-CM | POA: Diagnosis present

## 2023-03-18 DIAGNOSIS — N179 Acute kidney failure, unspecified: Secondary | ICD-10-CM | POA: Diagnosis not present

## 2023-03-18 DIAGNOSIS — K658 Other peritonitis: Secondary | ICD-10-CM | POA: Diagnosis present

## 2023-03-18 HISTORY — PX: LAPAROTOMY: SHX154

## 2023-03-18 LAB — URINALYSIS, ROUTINE W REFLEX MICROSCOPIC
Bacteria, UA: NONE SEEN
Bilirubin Urine: NEGATIVE
Glucose, UA: NEGATIVE mg/dL
Hgb urine dipstick: NEGATIVE
Ketones, ur: NEGATIVE mg/dL
Nitrite: NEGATIVE
Protein, ur: NEGATIVE mg/dL
Specific Gravity, Urine: 1.042 — ABNORMAL HIGH (ref 1.005–1.030)
Squamous Epithelial / HPF: NONE SEEN /HPF (ref 0–5)
pH: 5 (ref 5.0–8.0)

## 2023-03-18 LAB — COMPREHENSIVE METABOLIC PANEL
ALT: 15 U/L (ref 0–44)
AST: 17 U/L (ref 15–41)
Albumin: 3.1 g/dL — ABNORMAL LOW (ref 3.5–5.0)
Alkaline Phosphatase: 58 U/L (ref 38–126)
Anion gap: 8 (ref 5–15)
BUN: 25 mg/dL — ABNORMAL HIGH (ref 8–23)
CO2: 23 mmol/L (ref 22–32)
Calcium: 9.3 mg/dL (ref 8.9–10.3)
Chloride: 107 mmol/L (ref 98–111)
Creatinine, Ser: 1.06 mg/dL (ref 0.61–1.24)
GFR, Estimated: >60 mL/min (ref >60)
Glucose, Bld: 128 mg/dL — ABNORMAL HIGH (ref 70–99)
Potassium: 3.3 mmol/L — ABNORMAL LOW (ref 3.5–5.1)
Sodium: 138 mmol/L (ref 135–145)
Total Bilirubin: 0.5 mg/dL (ref 0.3–1.2)
Total Protein: 6.2 g/dL — ABNORMAL LOW (ref 6.5–8.1)

## 2023-03-18 LAB — CBC
HCT: 41.1 % (ref 39.0–52.0)
Hemoglobin: 13.3 g/dL (ref 13.0–17.0)
MCH: 29.1 pg (ref 26.0–34.0)
MCHC: 32.4 g/dL (ref 30.0–36.0)
MCV: 89.9 fL (ref 80.0–100.0)
Platelets: 248 10*3/uL (ref 150–400)
RBC: 4.57 MIL/uL (ref 4.22–5.81)
RDW: 13.2 % (ref 11.5–15.5)
WBC: 9.9 10*3/uL (ref 4.0–10.5)
nRBC: 0 % (ref 0.0–0.2)

## 2023-03-18 LAB — LIPASE, BLOOD: Lipase: 34 U/L (ref 11–51)

## 2023-03-18 LAB — TYPE AND SCREEN

## 2023-03-18 SURGERY — LAPAROTOMY, EXPLORATORY
Anesthesia: General

## 2023-03-18 MED ORDER — FENTANYL CITRATE (PF) 100 MCG/2ML IJ SOLN
25.0000 ug | INTRAMUSCULAR | Status: DC | PRN
  Administered 2023-03-19 (×3): 25 ug via INTRAVENOUS

## 2023-03-18 MED ORDER — IOHEXOL 300 MG/ML  SOLN
100.0000 mL | Freq: Once | INTRAMUSCULAR | Status: AC | PRN
  Administered 2023-03-18: 100 mL via INTRAVENOUS

## 2023-03-18 MED ORDER — PHENYLEPHRINE 80 MCG/ML (10ML) SYRINGE FOR IV PUSH (FOR BLOOD PRESSURE SUPPORT)
PREFILLED_SYRINGE | INTRAVENOUS | Status: DC | PRN
  Administered 2023-03-18 (×2): 80 ug via INTRAVENOUS

## 2023-03-18 MED ORDER — ROCURONIUM BROMIDE 100 MG/10ML IV SOLN
INTRAVENOUS | Status: DC | PRN
  Administered 2023-03-18: 60 mg via INTRAVENOUS
  Administered 2023-03-18 (×3): 20 mg via INTRAVENOUS

## 2023-03-18 MED ORDER — VASHE WOUND IRRIGATION OPTIME
TOPICAL | Status: DC | PRN
Start: 2023-03-18 — End: 2023-03-19
  Administered 2023-03-18: 34 [oz_av]

## 2023-03-18 MED ORDER — HYDROMORPHONE HCL 1 MG/ML IJ SOLN
0.5000 mg | INTRAMUSCULAR | Status: DC | PRN
  Administered 2023-03-19 – 2023-03-20 (×4): 0.5 mg via INTRAVENOUS
  Filled 2023-03-18 (×4): qty 0.5

## 2023-03-18 MED ORDER — FENTANYL CITRATE (PF) 100 MCG/2ML IJ SOLN
INTRAMUSCULAR | Status: AC
  Filled 2023-03-18: qty 2

## 2023-03-18 MED ORDER — GLYCOPYRROLATE 0.2 MG/ML IJ SOLN
INTRAMUSCULAR | Status: DC | PRN
  Administered 2023-03-18: .2 mg via INTRAVENOUS

## 2023-03-18 MED ORDER — ALBUMIN HUMAN 5 % IV SOLN
INTRAVENOUS | Status: AC
  Filled 2023-03-18: qty 500

## 2023-03-18 MED ORDER — PHENYLEPHRINE HCL-NACL 20-0.9 MG/250ML-% IV SOLN
INTRAVENOUS | Status: AC
  Filled 2023-03-18: qty 250

## 2023-03-18 MED ORDER — KETAMINE HCL 10 MG/ML IJ SOLN
INTRAMUSCULAR | Status: DC | PRN
  Administered 2023-03-18: 10 mg via INTRAVENOUS
  Administered 2023-03-18 (×2): 20 mg via INTRAVENOUS

## 2023-03-18 MED ORDER — ONDANSETRON HCL 4 MG/2ML IJ SOLN
4.0000 mg | Freq: Four times a day (QID) | INTRAMUSCULAR | Status: DC | PRN
  Administered 2023-03-27: 4 mg via INTRAVENOUS
  Filled 2023-03-18 (×2): qty 2

## 2023-03-18 MED ORDER — 0.9 % SODIUM CHLORIDE (POUR BTL) OPTIME
TOPICAL | Status: DC | PRN
  Administered 2023-03-18: 500 mL
  Administered 2023-03-18 (×4): 1000 mL

## 2023-03-18 MED ORDER — ONDANSETRON HCL 4 MG/2ML IJ SOLN
INTRAMUSCULAR | Status: DC | PRN
  Administered 2023-03-18: 4 mg via INTRAVENOUS

## 2023-03-18 MED ORDER — PANTOPRAZOLE SODIUM 40 MG IV SOLR
40.0000 mg | Freq: Every day | INTRAVENOUS | Status: DC
  Administered 2023-03-19 – 2023-04-02 (×15): 40 mg via INTRAVENOUS
  Filled 2023-03-18 (×15): qty 10

## 2023-03-18 MED ORDER — SODIUM CHLORIDE 0.9 % IV BOLUS
500.0000 mL | Freq: Once | INTRAVENOUS | Status: AC
  Administered 2023-03-18: 500 mL via INTRAVENOUS

## 2023-03-18 MED ORDER — ENOXAPARIN SODIUM 40 MG/0.4ML IJ SOSY
40.0000 mg | PREFILLED_SYRINGE | INTRAMUSCULAR | Status: AC
Start: 2023-03-19 — End: ?

## 2023-03-18 MED ORDER — SODIUM CHLORIDE (PF) 0.9 % IJ SOLN
INTRAMUSCULAR | Status: DC | PRN
  Administered 2023-03-18: 80 mL

## 2023-03-18 MED ORDER — PIPERACILLIN-TAZOBACTAM 3.375 G IVPB
3.3750 g | Freq: Three times a day (TID) | INTRAVENOUS | Status: AC
  Administered 2023-03-19 – 2023-03-25 (×20): 3.375 g via INTRAVENOUS
  Filled 2023-03-18 (×20): qty 50

## 2023-03-18 MED ORDER — SUGAMMADEX SODIUM 200 MG/2ML IV SOLN
INTRAVENOUS | Status: DC | PRN
  Administered 2023-03-18: 200 mg via INTRAVENOUS

## 2023-03-18 MED ORDER — PHENYLEPHRINE HCL-NACL 20-0.9 MG/250ML-% IV SOLN
INTRAVENOUS | Status: DC | PRN
Start: 2023-03-18 — End: 2023-03-19
  Administered 2023-03-18: 40 ug/min via INTRAVENOUS

## 2023-03-18 MED ORDER — LIDOCAINE HCL (CARDIAC) PF 100 MG/5ML IV SOSY
PREFILLED_SYRINGE | INTRAVENOUS | Status: DC | PRN
  Administered 2023-03-18: 80 mg via INTRAVENOUS

## 2023-03-18 MED ORDER — PIPERACILLIN-TAZOBACTAM 3.375 G IVPB: 3.3750 g | Freq: Three times a day (TID) | INTRAVENOUS | Status: DC

## 2023-03-18 MED ORDER — FENTANYL CITRATE PF 50 MCG/ML IJ SOSY
50.0000 ug | PREFILLED_SYRINGE | Freq: Once | INTRAMUSCULAR | Status: AC
  Administered 2023-03-18: 50 ug via INTRAVENOUS
  Filled 2023-03-18: qty 1

## 2023-03-18 MED ORDER — LACTATED RINGERS IV SOLN: INTRAVENOUS | Status: DC

## 2023-03-18 MED ORDER — ONDANSETRON HCL 4 MG/2ML IJ SOLN: 4.0000 mg | Freq: Once | INTRAMUSCULAR | Status: DC | PRN

## 2023-03-18 MED ORDER — PROPOFOL 10 MG/ML IV BOLUS
INTRAVENOUS | Status: AC
  Filled 2023-03-18: qty 20

## 2023-03-18 MED ORDER — ALBUMIN HUMAN 5 % IV SOLN: INTRAVENOUS | Status: DC | PRN

## 2023-03-18 MED ORDER — SUCCINYLCHOLINE CHLORIDE 200 MG/10ML IV SOSY
PREFILLED_SYRINGE | INTRAVENOUS | Status: DC | PRN
  Administered 2023-03-18: 100 mg via INTRAVENOUS

## 2023-03-18 MED ORDER — PIPERACILLIN-TAZOBACTAM 3.375 G IVPB 30 MIN
3.3750 g | Freq: Once | INTRAVENOUS | Status: AC
  Administered 2023-03-18: 3.375 g via INTRAVENOUS
  Filled 2023-03-18: qty 50

## 2023-03-18 MED ORDER — FENTANYL CITRATE (PF) 100 MCG/2ML IJ SOLN
INTRAMUSCULAR | Status: DC | PRN
  Administered 2023-03-18 (×4): 50 ug via INTRAVENOUS

## 2023-03-18 MED ORDER — EPHEDRINE SULFATE (PRESSORS) 50 MG/ML IJ SOLN
INTRAMUSCULAR | Status: DC | PRN
  Administered 2023-03-18 (×3): 5 mg via INTRAVENOUS
  Administered 2023-03-18: 10 mg via INTRAVENOUS

## 2023-03-18 MED ORDER — ACETAMINOPHEN 10 MG/ML IV SOLN
INTRAVENOUS | Status: AC
  Filled 2023-03-18: qty 100

## 2023-03-18 MED ORDER — PROPOFOL 10 MG/ML IV BOLUS
INTRAVENOUS | Status: DC | PRN
  Administered 2023-03-18: 120 mg via INTRAVENOUS

## 2023-03-18 MED ORDER — ACETAMINOPHEN 10 MG/ML IV SOLN
INTRAVENOUS | Status: DC | PRN
  Administered 2023-03-18: 1000 mg via INTRAVENOUS

## 2023-03-18 MED ORDER — ONDANSETRON 4 MG PO TBDP
4.0000 mg | ORAL_TABLET | Freq: Four times a day (QID) | ORAL | Status: DC | PRN
Start: 2023-03-18 — End: 2023-04-04

## 2023-03-18 MED ORDER — KETAMINE HCL 50 MG/5ML IJ SOSY
PREFILLED_SYRINGE | INTRAMUSCULAR | Status: AC
  Filled 2023-03-18: qty 5

## 2023-03-18 MED ORDER — DEXAMETHASONE SODIUM PHOSPHATE 10 MG/ML IJ SOLN
INTRAMUSCULAR | Status: DC | PRN
  Administered 2023-03-18: 8 mg via INTRAVENOUS

## 2023-03-18 SURGICAL SUPPLY — 57 items
APL PRP STRL LF DISP 70% ISPRP (MISCELLANEOUS) ×1
BRR ADH 6X5 SEPRAFILM 1 SHT (MISCELLANEOUS)
BULB RESERV EVAC DRAIN JP 100C (MISCELLANEOUS) IMPLANT
CHLORAPREP W/TINT 26 (MISCELLANEOUS) ×1 IMPLANT
DRAIN CHANNEL JP 19F (MISCELLANEOUS) IMPLANT
DRAIN PENROSE 0.625X18 (DRAIN) IMPLANT
DRAPE LAPAROTOMY 100X77 ABD (DRAPES) ×1 IMPLANT
DRSG OPSITE POSTOP 4X12 (GAUZE/BANDAGES/DRESSINGS) IMPLANT
DRSG TEGADERM 4X10 (GAUZE/BANDAGES/DRESSINGS) IMPLANT
ELECT CAUTERY BLADE TIP 2.5 (TIP) ×2
ELECT EZSTD 165MM 6.5IN (MISCELLANEOUS) ×1
ELECT REM PT RETURN 9FT ADLT (ELECTROSURGICAL) ×1
ELECTRODE CAUTERY BLDE TIP 2.5 (TIP) ×1 IMPLANT
ELECTRODE EZSTD 165MM 6.5IN (MISCELLANEOUS) ×1 IMPLANT
ELECTRODE REM PT RTRN 9FT ADLT (ELECTROSURGICAL) ×1 IMPLANT
GAUZE 4X4 16PLY ~~LOC~~+RFID DBL (SPONGE) ×1 IMPLANT
GAUZE SPONGE 4X4 12PLY STRL (GAUZE/BANDAGES/DRESSINGS) ×1 IMPLANT
GLOVE SURG SYN 7.0 (GLOVE) ×2 IMPLANT
GLOVE SURG SYN 7.0 PF PI (GLOVE) ×2 IMPLANT
GLOVE SURG SYN 7.5 E (GLOVE) ×2 IMPLANT
GLOVE SURG SYN 7.5 PF PI (GLOVE) ×2 IMPLANT
GOWN STRL REUS W/ TWL LRG LVL3 (GOWN DISPOSABLE) ×4 IMPLANT
GOWN STRL REUS W/TWL LRG LVL3 (GOWN DISPOSABLE) ×4
GRADUATE 1200CC STRL 31836 (MISCELLANEOUS) IMPLANT
HANDLE SUCTION POOLE (INSTRUMENTS) IMPLANT
HEMOSTAT ARISTA ABSORB 3G PWDR (HEMOSTASIS) IMPLANT
LABEL OR SOLS (LABEL) ×1 IMPLANT
LIGASURE IMPACT 36 18CM CVD LR (INSTRUMENTS) IMPLANT
MANIFOLD NEPTUNE II (INSTRUMENTS) ×1 IMPLANT
NDL HYPO 22X1.5 SAFETY MO (MISCELLANEOUS) ×1 IMPLANT
NEEDLE HYPO 22X1.5 SAFETY MO (MISCELLANEOUS) ×1 IMPLANT
NS IRRIG 1000ML POUR BTL (IV SOLUTION) ×1 IMPLANT
PACK BASIN MAJOR ARMC (MISCELLANEOUS) ×1 IMPLANT
PACK COLON CLEAN CLOSURE (MISCELLANEOUS) ×1 IMPLANT
RELOAD PROXIMATE 75MM BLUE (ENDOMECHANICALS) ×3 IMPLANT
RELOAD STAPLE 75 3.8 BLU REG (ENDOMECHANICALS) IMPLANT
SEPRAFILM MEMBRANE 5X6 (MISCELLANEOUS) IMPLANT
SPONGE T-LAP 18X18 ~~LOC~~+RFID (SPONGE) ×3 IMPLANT
STAPLER PROXIMATE 75MM BLUE (STAPLE) IMPLANT
STAPLER SKIN PROX 35W (STAPLE) ×1 IMPLANT
SUCTION POOLE HANDLE (INSTRUMENTS) ×2
SUT ETHILON 3 0 PS 1 (SUTURE) IMPLANT
SUT PDS AB 1 CT1 36 (SUTURE) ×1 IMPLANT
SUT PROLENE 2 0 SH DA (SUTURE) IMPLANT
SUT SILK 2 0 (SUTURE) ×1
SUT SILK 2-0 18XBRD TIE 12 (SUTURE) ×1 IMPLANT
SUT SILK 3-0 (SUTURE) ×1 IMPLANT
SUT VIC AB 2-0 SH 27 (SUTURE) ×2
SUT VIC AB 2-0 SH 27XBRD (SUTURE) IMPLANT
SUT VIC AB 3-0 SH 27 (SUTURE)
SUT VIC AB 3-0 SH 27X BRD (SUTURE) ×1 IMPLANT
SYR 10ML LL (SYRINGE) ×1 IMPLANT
SYR 20ML LL LF (SYRINGE) IMPLANT
TRAP FLUID SMOKE EVACUATOR (MISCELLANEOUS) ×1 IMPLANT
TRAY FOLEY MTR SLVR 16FR STAT (SET/KITS/TRAYS/PACK) ×1 IMPLANT
TUBING CONNECTING 10 (TUBING) IMPLANT
WATER STERILE IRR 500ML POUR (IV SOLUTION) ×1 IMPLANT

## 2023-03-18 NOTE — ED Notes (Signed)
ED TO INPATIENT HANDOFF REPORT  ED Nurse Name and Phone #:  RN   S Name/Age/Gender Don Arias 89 y.o. male Room/Bed: ED03A/ED03A  Code Status   Code Status: DNR  Home/SNF/Other Home Patient oriented to: self, place, time, and situation Is this baseline? Yes   Triage Complete: Triage complete  Chief Complaint Pneumoperitoneum [K66.8]  Triage Note Pt in via POV from home w/ complaints of ongoing right side abdominal pain x 1 week, denies any N/V/D.  States pain is worsening.  Patient appears distended; family states that is not normal for him.    Ambulatory to triage; NAD noted at this time.     Allergies Allergies  Allergen Reactions   Nitroglycerin Other (See Comments)    Cardiac Arrest    Level of Care/Admitting Diagnosis ED Disposition     ED Disposition  Admit   Condition  --   Comment  Hospital Area: [facility] [[id number]]  Level of Care: Med-Surg [16]  Covid Evaluation: Asymptomatic - no recent exposure (last 10 days) testing not required  Diagnosis: Pneumoperitoneum [811914]  Admitting Physician: Henrene Dodge [[id number]]  Attending Physician: Henrene Dodge [[id number]]  Certification:: I certify this patient will need inpatient services for at least 2 midnights  Estimated Length of Stay: 5          B Medical/Surgery History Past Medical History:  Diagnosis Date   Cancer (HCC)    skin   Hypertension    Past Surgical History:  Procedure Laterality Date   CATARACT EXTRACTION     PROSTATE SURGERY     RECONSTRUCTION OF EYELID     SKIN CANCER EXCISION       A IV Location/Drains/Wounds Patient Lines/Drains/Airways Status     Active Line/Drains/Airways     Name Placement date Placement time Site Days   Peripheral IV 03/18/23 Posterior;Right Hand 03/18/23  1630  Hand  less than 1   Peripheral IV 03/18/23 20 G Right;Anterior Forearm 03/18/23  1941  Forearm  less than 1            Intake/Output Last 24  hours  Intake/Output Summary (Last 24 hours) at 03/18/2023 1950 Last data filed at 03/18/2023 1843 Gross per 24 hour  Intake 600 ml  Output --  Net 600 ml    Labs/Imaging Results for orders placed or performed during the hospital encounter of 03/18/23 (from the past 48 hour(s))  Lipase, blood     Status: None   Collection Time: 03/18/23  4:26 PM  Result Value Ref Range   Lipase 34 11 - 51 U/L    Comment: Performed at [facility], [address] [place], [place] [address]  Comprehensive metabolic panel     Status: Abnormal   Collection Time: 03/18/23  4:26 PM  Result Value Ref Range   Sodium 138 135 - 145 mmol/L   Potassium 3.3 (L) 3.5 - 5.1 mmol/L   Chloride 107 98 - 111 mmol/L   CO2 23 22 - 32 mmol/L   Glucose, Bld 128 (H) 70 - 99 mg/dL    Comment: Glucose reference range applies only to samples taken after fasting for at least 8 hours.   BUN 25 (H) 8 - 23 mg/dL   Creatinine, Ser 2.95 0.61 - 1.24 mg/dL   Calcium 9.3 8.9 - 28.4 mg/dL   Total Protein 6.2 (L) 6.5 - 8.1 g/dL   Albumin 3.1 (L) 3.5 - 5.0 g/dL   AST 17 15 - 41 U/L  ALT 15 0 - 44 U/L   Alkaline Phosphatase 58 38 - 126 U/L   Total Bilirubin 0.5 0.3 - 1.2 mg/dL   GFR, Estimated >16 >10 mL/min    Comment: (NOTE) Calculated using the CKD-EPI Creatinine Equation (2021)    Anion gap 8 5 - 15    Comment: Performed at [facility], [address] [place], [place] [address]  CBC     Status: None   Collection Time: 03/18/23  4:26 PM  Result Value Ref Range   WBC 9.9 4.0 - 10.5 K/uL   RBC 4.57 4.22 - 5.81 MIL/uL   Hemoglobin 13.3 13.0 - 17.0 g/dL   HCT 40.9 81.1 - 91.4 %   MCV 89.9 80.0 - 100.0 fL   MCH 29.1 26.0 - 34.0 pg   MCHC 32.4 30.0 - 36.0 g/dL   RDW 78.2 95.6 - 21.3 %   Platelets 248 150 - 400 K/uL   nRBC 0.0 0.0 - 0.2 %    Comment: Performed at [facility], [address] [place], [place] [address]  Urinalysis, Routine w reflex microscopic -Urine, Clean Catch     Status:  Abnormal   Collection Time: 03/18/23  4:26 PM  Result Value Ref Range   Color, Urine YELLOW (A) YELLOW   APPearance CLEAR (A) CLEAR   Specific Gravity, Urine 1.042 (H) 1.005 - 1.030   pH 5.0 5.0 - 8.0   Glucose, UA NEGATIVE NEGATIVE mg/dL   Hgb urine dipstick NEGATIVE NEGATIVE   Bilirubin Urine NEGATIVE NEGATIVE   Ketones, ur NEGATIVE NEGATIVE mg/dL   Protein, ur NEGATIVE NEGATIVE mg/dL   Nitrite NEGATIVE NEGATIVE   Leukocytes,Ua TRACE (A) NEGATIVE   RBC / HPF 0-5 0 - 5 RBC/hpf   WBC, UA 0-5 0 - 5 WBC/hpf   Bacteria, UA NONE SEEN NONE SEEN   Squamous Epithelial / HPF NONE SEEN 0 - 5 /HPF   Mucus PRESENT     Comment: Performed at [facility], [address], [place], [place] [address]   CT ABDOMEN PELVIS W CONTRAST  Result Date: 03/18/2023 CLINICAL DATA:  Right-sided abdominal pain and distention. EXAM: CT ABDOMEN AND PELVIS WITH CONTRAST TECHNIQUE: Multidetector CT imaging of the abdomen and pelvis was performed using the standard protocol following bolus administration of intravenous contrast. RADIATION DOSE REDUCTION: This exam was performed according to the departmental dose-optimization program which includes automated exposure control, adjustment of the mA and/or kV according to patient size and/or use of iterative reconstruction technique. CONTRAST:  OMNIPAQUE IOHEXOL 300 MG/ML  SOLN COMPARISON:  CT 2011 FINDINGS: Lower chest: Mild linear opacity lung bases likely scar or atelectasis. No pleural effusion. Coronary artery calcifications are seen. Significant calcifications in the area of the aortic valve. Hepatobiliary: Low-attenuation segment 7 liver lesion seen measuring 2.6 by 2.2 cm. Gallbladder is nondilated. Patent portal vein. Pancreas: Mild atrophy of the pancreas. Several punctate calcifications are seen. Please correlate for any history of chronic calcific pancreatitis. Spleen: Normal in size without focal abnormality. Adrenals/Urinary Tract: Adrenal glands are  preserved. Moderate atrophy of the left adrenal gland with a focal area towards the upper pole laterally. Punctate nonobstructing lower pole left-sided renal stone. No left-sided renal collecting system dilatation. There is a 12 mm stone in the bladder along the left side near the margin of the UVJ. The bladder is underdistended with slight wall thickening. Right kidney is without enhancing mass or collecting system dilatation. The right ureter has a normal course and caliber down  to the bladder. Stomach/Bowel: No oral contrast. Scattered stool. Sigmoid colon diverticula. There is dilatation of the cecum with diameter approaching 10 cm with significant luminal debris in stool. There is possible mass lesion along the ascending colon distal to the ileocecal valve as seen on axial image 50, coronal image 44. This has at the level of caliber change along the colon. A developing mass lesion or neoplasm is possible. Stomach and small bowel is nondilated. Vascular/Lymphatic: There is a infrarenal abdominal aortic aneurysm identified extending posterior. There is some wispy enhancement along the area of mural plaque and thrombus in the aneurysm which overall has diameter of 4.1 x 4.2 cm. This has centered just above the aortic bifurcation. Normal caliber IVC. No specific abnormal retroperitoneal lymph node enlargement however there are some prominent mesenteric nodes on the right side anteriorly such as axial series 2, image 42, 44. These measure up to 11 mm in size and has a rounded, abnormal morphology. In addition there is a rounded area seen immediately posterior to the SMV on series 40, image 2 measuring 13 mm. This appears to follow the density of vasculature on the 2 phases obtained. This actually was present on the study of 2011 and may be a small venous varix or other vascular lesion. Reproductive: Enlarged prostate with heterogeneous enhancement posteriorly on the left. Please correlate with patient's PSA. Other:  Scattered free air identified. Etiology of which is uncertain. Trace free fluid in the right pericolic gutter. Musculoskeletal: Scattered degenerative changes of the spine and pelvis. Transitional lumbosacral segment. Curvature of the spine. Critical Value/emergent results were called by telephone at the time of interpretation on 03/18/2023 at 3:25 pm to provider [facility] , who verbally acknowledged these results. IMPRESSION: Free intra-abdominal air. Bowel perforation until proven otherwise. The exact etiology of the free air can not be determined on this examination. There is a mass lesion identified along the ascending colon with focal caliber change of the bowel worrisome for potential neoplasm. There also some abnormal nodes in the right hemi abdominal mesentery and possible liver metastasis. Further workup when appropriate. This could be the source of the free air but indeterminate. There is some fluid adjacent to the ascending colon in the pericolic gutter. Bilateral renal atrophy. Punctate nonobstructing left-sided renal stone. Left-sided bladder stone. Enlarged prostate with heterogeneous enhancement. Please correlate with patient's PSA. Fusiform 4.2 cm infrarenal abdominal aortic aneurysm with some enhancement along the mural plaque posteriorly. Recommend follow-up every 12 months and vascular consultation. This recommendation follows ACR consensus guidelines: White Paper of the ACR Incidental Findings Committee II on Vascular Findings. J Am Coll Radiol 2013; 10:789-794. Electronically Signed   By: Karen Kays M.D.   On: 03/18/2023 18:26    Pending Labs Unresulted Labs (From admission, onward)     Start     Ordered   03/19/23 0500  CBC  Tomorrow morning,   R        03/18/23 1935   03/19/23 0500  Basic metabolic panel  Tomorrow morning,   R        03/18/23 1935   03/19/23 0500  Magnesium  Tomorrow morning,   R        03/18/23 1935   03/18/23 1936  Type and screen [facility]  ONCE - STAT,   STAT       Comments: [facility]    03/18/23 1935            Vitals/Pain Today's Vitals  03/18/23 1757 03/18/23 1936 03/18/23 1937 03/18/23 1949  BP:  (!) 184/113    Pulse:  82 83   Resp:  16 14   Temp:    98 F (36.7 C)  TempSrc:    Oral  SpO2:  98% 96%   Weight:      Height:      PainSc: 3        Isolation Precautions No active isolations  Medications Medications  lactated ringers infusion (has no administration in time range)  HYDROmorphone (DILAUDID) injection 0.5 mg (has no administration in time range)  ondansetron (ZOFRAN-ODT) disintegrating tablet 4 mg (has no administration in time range)    Or  ondansetron (ZOFRAN) injection 4 mg (has no administration in time range)  pantoprazole (PROTONIX) injection 40 mg (has no administration in time range)  enoxaparin (LOVENOX) injection 40 mg (has no administration in time range)  piperacillin-tazobactam (ZOSYN) IVPB 3.375 g (has no administration in time range)  sodium chloride 0.9 % bolus 500 mL (0 mLs Intravenous Stopped 03/18/23 1843)  fentaNYL (SUBLIMAZE) injection 50 mcg (50 mcg Intravenous Given 03/18/23 1756)  iohexol (OMNIPAQUE) 300 MG/ML solution 100 mL (100 mLs Intravenous Contrast Given 03/18/23 1739)  piperacillin-tazobactam (ZOSYN) IVPB 3.375 g (0 g Intravenous Stopped 03/18/23 1936)    Mobility walks     Focused Assessments GI    R Recommendations: See Admitting Provider Note  Report given to:   Additional Notes: Pt scheduled for OR. Has been dressed into hospital gown, provided non-slip socks, and toileting provided. IV x2. Type and screen pending. VS updated. Pt A&Ox4, ambulatory without assistance.

## 2023-03-18 NOTE — ED Notes (Signed)
Belongings from bedside placed in pt belongings bag and given to pt's daughter Dewayne Hatch at bedside. Belongings include: bilateral hearing aids, glasses, clothing, and shoes.

## 2023-03-18 NOTE — Anesthesia Preprocedure Evaluation (Signed)
Anesthesia Evaluation  Patient identified by MRN, date of birth, ID band Patient awake    Reviewed: Allergy & Precautions, H&P , NPO status , Patient's Chart, lab work & pertinent test results, reviewed documented beta blocker date and time   History of Anesthesia Complications Negative for: history of anesthetic complications  Airway Mallampati: II  TM Distance: >3 FB Neck ROM: full    Dental  (+) Edentulous Upper, Edentulous Lower, Dental Advidsory Given   Pulmonary neg pulmonary ROS, Continuous Positive Airway Pressure Ventilation , former smoker   Pulmonary exam normal breath sounds clear to auscultation       Cardiovascular Exercise Tolerance: Good hypertension, (-) angina (-) Past MI and (-) Cardiac Stents Normal cardiovascular exam(-) dysrhythmias + Valvular Problems/Murmurs ("one of my valves doesn't work right" - likely aortic based on description)  Rhythm:regular Rate:Normal     Neuro/Psych  PSYCHIATRIC DISORDERS Anxiety     negative neurological ROS     GI/Hepatic Neg liver ROS,GERD  ,,  Endo/Other  negative endocrine ROS    Renal/GU negative Renal ROS  negative genitourinary   Musculoskeletal   Abdominal   Peds  Hematology negative hematology ROS (+)   Anesthesia Other Findings Past Medical History: No date: Cancer (HCC)     Comment:  skin No date: Hypertension   Reproductive/Obstetrics negative OB ROS                             Anesthesia Physical Anesthesia Plan  ASA: 3 and emergent  Anesthesia Plan: General   Post-op Pain Management:    Induction: Intravenous, Rapid sequence and Cricoid pressure planned  PONV Risk Score and Plan: 2 and Ondansetron, Dexamethasone and Treatment may vary due to age or medical condition  Airway Management Planned: Oral ETT  Additional Equipment:   Intra-op Plan:   Post-operative Plan: Extubation in OR  Informed Consent: I have  reviewed the patients History and Physical, chart, labs and discussed the procedure including the risks, benefits and alternatives for the proposed anesthesia with the patient or authorized representative who has indicated his/her understanding and acceptance.     Dental Advisory Given  Plan Discussed with: Anesthesiologist, CRNA and Surgeon  Anesthesia Plan Comments:         Anesthesia Quick Evaluation

## 2023-03-18 NOTE — ED Notes (Signed)
With further assessement, patient's abdomen is visibly swollen on right side.

## 2023-03-18 NOTE — ED Notes (Signed)
Consulting provider at bedside

## 2023-03-18 NOTE — Anesthesia Procedure Notes (Signed)
Procedure Name: Intubation Date/Time: 03/18/2023 8:37 PM  Performed by: Jaye Beagle, CRNAPre-anesthesia Checklist: Patient identified, Emergency Drugs available, Suction available and Patient being monitored Patient Re-evaluated:Patient Re-evaluated prior to induction Oxygen Delivery Method: Circle system utilized Preoxygenation: Pre-oxygenation with 100% oxygen Induction Type: IV induction, Rapid sequence and Cricoid Pressure applied Laryngoscope Size: McGraph and 4 Grade View: Grade I Tube type: Oral Tube size: 7.5 mm Number of attempts: 1 Airway Equipment and Method: Stylet and Oral airway Placement Confirmation: ETT inserted through vocal cords under direct vision, positive ETCO2 and breath sounds checked- equal and bilateral Secured at: 22 cm Tube secured with: Tape Dental Injury: Teeth and Oropharynx as per pre-operative assessment

## 2023-03-18 NOTE — ED Triage Notes (Signed)
Pt in via POV from home w/ complaints of ongoing right side abdominal pain x 1 week, denies any N/V/D.  States pain is worsening.  Patient appears distended; family states that is not normal for him.    Ambulatory to triage; NAD noted at this time.

## 2023-03-18 NOTE — Consult Note (Signed)
Pharmacy Antibiotic Note  Don Arias is a 89 y.o. male admitted on 03/18/2023 with  intra-abdominal infection . CT abdomen concerning for bower perforation. Pharmacy has been consulted for Zosyn dosing.  Plan: Day 1 of antibiotics Start Zosyn 3.375 g IV Q8H Continue to monitor renal function and follow culture results   Height: 5\' 10"  (177.8 cm) Weight: 81.6 kg (180 lb) IBW/kg (Calculated) : 73  Temp (24hrs), Avg:98.3 F (36.8 C), Min:98.3 F (36.8 C), Max:98.3 F (36.8 C)  Recent Labs  Lab 03/18/23 1626  WBC 9.9  CREATININE 1.06    Estimated Creatinine Clearance: 47.8 mL/min (by C-G formula based on SCr of 1.06 mg/dL).    Allergies  Allergen Reactions   Nitroglycerin Other (See Comments)    Cardiac Arrest    Antimicrobials this admission: 7/27 Zosyn >>   Dose adjustments this admission: N/A  Microbiology results: N/A  Thank you for allowing pharmacy to be a part of this patient's care.  Celene Squibb, PharmD Clinical Pharmacist 03/18/2023 7:42 PM

## 2023-03-18 NOTE — ED Provider Notes (Signed)
[facility] Provider Note    Event Date/Time   First MD Initiated Contact with Patient 03/18/23 1700     (approximate)   History   Abdominal Pain   HPI  Don Arias is a 89 y.o. male who presents to the emergency department today because of concerns for abdominal pain.  It has been going on for at least a week.  Initially was located throughout his abdomen although it is now worse on the right side.  He has noticed associated distention.  Has had slightly decreased appetite a couple of the days.  Does have chronic issues with constipation and last had a bowel movement 3 days ago.  This is not an unusual amount of time for him to go without bowel movements.  He denies any fevers or chills.     Physical Exam   Triage Vital Signs: ED Triage Vitals  Encounter Vitals Group     BP 03/18/23 1603 (!) 150/96     Systolic BP Percentile --      Diastolic BP Percentile --      Pulse Rate 03/18/23 1603 85     Resp 03/18/23 1603 20     Temp 03/18/23 1603 98.3 F (36.8 C)     Temp Source 03/18/23 1603 Oral     SpO2 03/18/23 1603 94 %     Weight 03/18/23 1609 180 lb (81.6 kg)     Height 03/18/23 1609 5\' 10"  (1.778 m)     Head Circumference --      Peak Flow --      Pain Score 03/18/23 1606 4     Pain Loc --      Pain Education --      Exclude from Growth Chart --     Most recent vital signs: Vitals:   03/18/23 1603  BP: (!) 150/96  Pulse: 85  Resp: 20  Temp: 98.3 F (36.8 C)  SpO2: 94%   General: Awake, alert, oriented. CV:  Good peripheral perfusion. Regular rate and rhythm. Resp:  Normal effort. Lungs clear. Abd:  Distention.  Tender to palpation on the right side.  ED Results / Procedures / Treatments   Labs (all labs ordered are listed, but only abnormal results are displayed) Labs Reviewed  COMPREHENSIVE METABOLIC PANEL - Abnormal; Notable for the following components:      Result Value   Potassium 3.3 (*)    Glucose, Bld 128 (*)     BUN 25 (*)    Total Protein 6.2 (*)    Albumin 3.1 (*)    All other components within normal limits  LIPASE, BLOOD  CBC  URINALYSIS, ROUTINE W REFLEX MICROSCOPIC     EKG  I, Phineas Semen, attending physician, personally viewed and interpreted this EKG  EKG Time: 1608 Rate: 94 Rhythm: normal sinus rhythm Axis: left axis deviation Intervals: qtc 475 QRS: RBBB, LAFB ST changes: no st elevation Impression: abnormal ekg   RADIOLOGY I independently interpreted and visualized the CT abd/pel. My interpretation: large amount of stool, free air Radiology interpretation:  IMPRESSION:  Free intra-abdominal air. Bowel perforation until proven otherwise.  The exact etiology of the free air can not be determined on this  examination.    There is a mass lesion identified along the ascending colon with  focal caliber change of the bowel worrisome for potential neoplasm.  There also some abnormal nodes in the right hemi abdominal mesentery  and possible liver metastasis. Further workup when  appropriate. This  could be the source of the free air but indeterminate. There is some  fluid adjacent to the ascending colon in the pericolic gutter.    Bilateral renal atrophy. Punctate nonobstructing left-sided renal  stone. Left-sided bladder stone.    Enlarged prostate with heterogeneous enhancement. Please correlate  with patient's PSA.    Fusiform 4.2 cm infrarenal abdominal aortic aneurysm with some  enhancement along the mural plaque posteriorly. Recommend follow-up  every 12 months and vascular consultation. This recommendation  follows ACR consensus guidelines: White Paper of the ACR Incidental  Findings Committee II on Vascular Findings. J Am Coll Radiol 2013;  10:789-794.    I, Phineas Semen, personally discussed these images and results by phone with the on-call radiologist and used this discussion as part of my medical decision making.    PROCEDURES:  Critical Care  performed: Yes  CRITICAL CARE Performed by: Phineas Semen   Total critical care time: 30 minutes  Critical care time was exclusive of separately billable procedures and treating other patients.  Critical care was necessary to treat or prevent imminent or life-threatening deterioration.  Critical care was time spent personally by me on the following activities: development of treatment plan with patient and/or surrogate as well as nursing, discussions with consultants, evaluation of patient's response to treatment, examination of patient, obtaining history from patient or surrogate, ordering and performing treatments and interventions, ordering and review of laboratory studies, ordering and review of radiographic studies, pulse oximetry and re-evaluation of patient's condition.   Procedures    MEDICATIONS ORDERED IN ED: Medications - No data to display   IMPRESSION / MDM / ASSESSMENT AND PLAN / ED COURSE  I reviewed the triage vital signs and the nursing notes.                              Differential diagnosis includes, but is not limited to, appendicitis, obstruction, volvulus, constipation  Patient's presentation is most consistent with acute presentation with potential threat to life or bodily function.   The patient is on the cardiac monitor to evaluate for evidence of arrhythmia and/or significant heart rate changes.  Patient presented to the emergency department today because of concerns for abdominal pain.  On exam patient is distended with tenderness primarily on the right side.  Will obtain CT scan to evaluate.  Will give IV fluids and pain medication.  CT scan is concerning for free air.  Also solid mass and colon.  I discussed these findings with the patient and family.  Discussed with Dr. Aleen Campi with surgery who will evaluate patient for admission and possible OR.  Patient was given dose of Zosyn.     FINAL CLINICAL IMPRESSION(S) / ED DIAGNOSES   Final  diagnoses:  Pneumoperitoneum     Note:  This document was prepared using Dragon voice recognition software and may include unintentional dictation errors.    Phineas Semen, MD 03/18/23 2002

## 2023-03-18 NOTE — H&P (Signed)
Date of Admission:  03/18/2023  Reason for Admission:  bowel perforation  History of Present Illness: Don Arias is a 89 y.o. male presenting with a 1.5 week history of right sided abdominal pain which has progressively getting worse.  He was concerned that he may have pulled a muscle and was also worried as the pain worsened that he may have appendicitis based on the location of his pain mostly in right lower quadrant.  Denies any nausea or vomiting and is having flatus still.  However, he has had issues with constipation, particularly the last two years.  He was trying stool softeners at first, but has ben getting worse.  Denies any blood in his stool, denies any fatigue or weakness, denies any weight loss.  He is very functional and lives independently and mows his lawn.  He has never had a colonoscopy.  In the ED, his workup showed normal WBC of 9.9 with hemoglobin 13.3.  His creatinine is 1.06 which is at his baseline with mild dehydration with an elevated BUN of 25 and low potassium of 3.3.  His vital signs were all normal without any tachycardia or hypotension or fevers.  He did have a CT scan of the abdomen pelvis which showed pneumoperitoneum with an area of narrowing in the mid ascending colon resulting in a large bowel obstruction of the cecum with a very distended cecum measuring up to 10 cm.  Small bowel is normal in caliber consistent with a functioning ileocecal valve.  Past Medical History: Past Medical History:  Diagnosis Date   Cancer (HCC)    skin   Hypertension      Past Surgical History: Past Surgical History:  Procedure Laterality Date   CATARACT EXTRACTION     PROSTATE SURGERY     RECONSTRUCTION OF EYELID     SKIN CANCER EXCISION      Home Medications: Prior to Admission medications   Medication Sig Start Date End Date Taking? Authorizing Provider  clonazePAM (KLONOPIN) 0.5 MG tablet Take 1 tablet (0.5 mg total) by mouth at bedtime. 04/16/21   Clapacs, Jackquline Denmark, MD  FLUoxetine (PROZAC) 20 MG capsule Take 1 capsule (20 mg total) by mouth daily. 04/17/21   Clapacs, Jackquline Denmark, MD  pantoprazole (PROTONIX) 40 MG tablet Take 1 tablet (40 mg total) by mouth daily. 04/17/21   Clapacs, Jackquline Denmark, MD    Allergies: Allergies  Allergen Reactions   Nitroglycerin Other (See Comments)    Cardiac Arrest    Social History:  reports that he has quit smoking. His smoking use included cigarettes. He has quit using smokeless tobacco. He reports that he does not currently use alcohol. He reports that he does not use drugs.   Family History: History reviewed. No pertinent family history.  Review of Systems: Review of Systems  Constitutional:  Negative for chills and fever.  HENT:  Negative for hearing loss.   Respiratory:  Negative for shortness of breath.   Cardiovascular:  Negative for chest pain.  Gastrointestinal:  Positive for abdominal pain and constipation. Negative for blood in stool, nausea and vomiting.  Genitourinary:  Negative for dysuria.  Musculoskeletal:  Negative for myalgias.  Skin:  Negative for rash.  Neurological:  Negative for dizziness.  Psychiatric/Behavioral:  Negative for depression.     Physical Exam BP (!) 150/96 (BP Location: Left Arm)   Pulse 85   Temp 98.3 F (36.8 C) (Oral)   Resp 20   Ht 5\' 10"  (1.778 m)  Wt 81.6 kg   SpO2 94%   BMI 25.83 kg/m  CONSTITUTIONAL: No acute distress, well-nourished HEENT:  Normocephalic, atraumatic, extraocular motion intact. NECK: Trachea is midline, and there is no jugular venous distension.  RESPIRATORY:  Normal respiratory effort without pathologic use of accessory muscles. CARDIOVASCULAR: Regular rhythm and rate  GI: The abdomen is soft, distended, with some tenderness to palpation particular in the right abdomen and right lower quadrant.  Some peritonitis but not diffuse.  MUSCULOSKELETAL:  Normal muscle strength and tone in all four extremities.  No peripheral edema or cyanosis. SKIN:  Skin turgor is normal. There are no pathologic skin lesions.  NEUROLOGIC:  Motor and sensation is grossly normal.  Cranial nerves are grossly intact. PSYCH:  Alert and oriented to person, place and time. Affect is normal.  Laboratory Analysis: Results for orders placed or performed during the hospital encounter of 03/18/23 (from the past 24 hour(s))  Lipase, blood     Status: None   Collection Time: 03/18/23  4:26 PM  Result Value Ref Range   Lipase 34 11 - 51 U/L  Comprehensive metabolic panel     Status: Abnormal   Collection Time: 03/18/23  4:26 PM  Result Value Ref Range   Sodium 138 135 - 145 mmol/L   Potassium 3.3 (L) 3.5 - 5.1 mmol/L   Chloride 107 98 - 111 mmol/L   CO2 23 22 - 32 mmol/L   Glucose, Bld 128 (H) 70 - 99 mg/dL   BUN 25 (H) 8 - 23 mg/dL   Creatinine, Ser 0.34 0.61 - 1.24 mg/dL   Calcium 9.3 8.9 - 74.2 mg/dL   Total Protein 6.2 (L) 6.5 - 8.1 g/dL   Albumin 3.1 (L) 3.5 - 5.0 g/dL   AST 17 15 - 41 U/L   ALT 15 0 - 44 U/L   Alkaline Phosphatase 58 38 - 126 U/L   Total Bilirubin 0.5 0.3 - 1.2 mg/dL   GFR, Estimated >59 >56 mL/min   Anion gap 8 5 - 15  CBC     Status: None   Collection Time: 03/18/23  4:26 PM  Result Value Ref Range   WBC 9.9 4.0 - 10.5 K/uL   RBC 4.57 4.22 - 5.81 MIL/uL   Hemoglobin 13.3 13.0 - 17.0 g/dL   HCT 38.7 56.4 - 33.2 %   MCV 89.9 80.0 - 100.0 fL   MCH 29.1 26.0 - 34.0 pg   MCHC 32.4 30.0 - 36.0 g/dL   RDW 95.1 88.4 - 16.6 %   Platelets 248 150 - 400 K/uL   nRBC 0.0 0.0 - 0.2 %  Urinalysis, Routine w reflex microscopic -Urine, Clean Catch     Status: Abnormal   Collection Time: 03/18/23  4:26 PM  Result Value Ref Range   Color, Urine YELLOW (A) YELLOW   APPearance CLEAR (A) CLEAR   Specific Gravity, Urine 1.042 (H) 1.005 - 1.030   pH 5.0 5.0 - 8.0   Glucose, UA NEGATIVE NEGATIVE mg/dL   Hgb urine dipstick NEGATIVE NEGATIVE   Bilirubin Urine NEGATIVE NEGATIVE   Ketones, ur NEGATIVE NEGATIVE mg/dL   Protein, ur NEGATIVE  NEGATIVE mg/dL   Nitrite NEGATIVE NEGATIVE   Leukocytes,Ua TRACE (A) NEGATIVE   RBC / HPF 0-5 0 - 5 RBC/hpf   WBC, UA 0-5 0 - 5 WBC/hpf   Bacteria, UA NONE SEEN NONE SEEN   Squamous Epithelial / HPF NONE SEEN 0 - 5 /HPF   Mucus PRESENT  Imaging: CT ABDOMEN PELVIS W CONTRAST  Result Date: 03/18/2023 CLINICAL DATA:  Right-sided abdominal pain and distention. EXAM: CT ABDOMEN AND PELVIS WITH CONTRAST TECHNIQUE: Multidetector CT imaging of the abdomen and pelvis was performed using the standard protocol following bolus administration of intravenous contrast. RADIATION DOSE REDUCTION: This exam was performed according to the departmental dose-optimization program which includes automated exposure control, adjustment of the mA and/or kV according to patient size and/or use of iterative reconstruction technique. CONTRAST:  OMNIPAQUE IOHEXOL 300 MG/ML  SOLN COMPARISON:  CT 2011 FINDINGS: Lower chest: Mild linear opacity lung bases likely scar or atelectasis. No pleural effusion. Coronary artery calcifications are seen. Significant calcifications in the area of the aortic valve. Hepatobiliary: Low-attenuation segment 7 liver lesion seen measuring 2.6 by 2.2 cm. Gallbladder is nondilated. Patent portal vein. Pancreas: Mild atrophy of the pancreas. Several punctate calcifications are seen. Please correlate for any history of chronic calcific pancreatitis. Spleen: Normal in size without focal abnormality. Adrenals/Urinary Tract: Adrenal glands are preserved. Moderate atrophy of the left adrenal gland with a focal area towards the upper pole laterally. Punctate nonobstructing lower pole left-sided renal stone. No left-sided renal collecting system dilatation. There is a 12 mm stone in the bladder along the left side near the margin of the UVJ. The bladder is underdistended with slight wall thickening. Right kidney is without enhancing mass or collecting system dilatation. The right ureter has a normal  course and caliber down to the bladder. Stomach/Bowel: No oral contrast. Scattered stool. Sigmoid colon diverticula. There is dilatation of the cecum with diameter approaching 10 cm with significant luminal debris in stool. There is possible mass lesion along the ascending colon distal to the ileocecal valve as seen on axial image 50, coronal image 44. This has at the level of caliber change along the colon. A developing mass lesion or neoplasm is possible. Stomach and small bowel is nondilated. Vascular/Lymphatic: There is a infrarenal abdominal aortic aneurysm identified extending posterior. There is some wispy enhancement along the area of mural plaque and thrombus in the aneurysm which overall has diameter of 4.1 x 4.2 cm. This has centered just above the aortic bifurcation. Normal caliber IVC. No specific abnormal retroperitoneal lymph node enlargement however there are some prominent mesenteric nodes on the right side anteriorly such as axial series 2, image 42, 44. These measure up to 11 mm in size and has a rounded, abnormal morphology. In addition there is a rounded area seen immediately posterior to the SMV on series 40, image 2 measuring 13 mm. This appears to follow the density of vasculature on the 2 phases obtained. This actually was present on the study of 2011 and may be a small venous varix or other vascular lesion. Reproductive: Enlarged prostate with heterogeneous enhancement posteriorly on the left. Please correlate with patient's PSA. Other: Scattered free air identified. Etiology of which is uncertain. Trace free fluid in the right pericolic gutter. Musculoskeletal: Scattered degenerative changes of the spine and pelvis. Transitional lumbosacral segment. Curvature of the spine. Critical Value/emergent results were called by telephone at the time of interpretation on 03/18/2023 at 3:25 pm to provider [facility] , who verbally acknowledged these results. IMPRESSION: Free intra-abdominal air.  Bowel perforation until proven otherwise. The exact etiology of the free air can not be determined on this examination. There is a mass lesion identified along the ascending colon with focal caliber change of the bowel worrisome for potential neoplasm. There also some abnormal nodes in the right hemi abdominal  mesentery and possible liver metastasis. Further workup when appropriate. This could be the source of the free air but indeterminate. There is some fluid adjacent to the ascending colon in the pericolic gutter. Bilateral renal atrophy. Punctate nonobstructing left-sided renal stone. Left-sided bladder stone. Enlarged prostate with heterogeneous enhancement. Please correlate with patient's PSA. Fusiform 4.2 cm infrarenal abdominal aortic aneurysm with some enhancement along the mural plaque posteriorly. Recommend follow-up every 12 months and vascular consultation. This recommendation follows ACR consensus guidelines: White Paper of the ACR Incidental Findings Committee II on Vascular Findings. J Am Coll Radiol 2013; 10:789-794. Electronically Signed   By: Karen Kays M.D.   On: 03/18/2023 18:26    Assessment and Plan: This is a 89 y.o. male with pneumoperitoneum likely as a result of large bowel obstruction with a very distended cecum.  - Discussed with the patient the findings on the CT scan showing an area of narrowing in the mid ascending colon resulting in the very distended cecum which is likely the source of perforation.  There is no abscess formation but there is a significant amount of free air particularly around the right side of the abdomen and upper abdomen.  Clinically, he is not toxic but he is having abdominal pain particular in the right side.  Discussed with him that given the findings, this area of narrowing may potentially be cancer but we will not know until the area is resected.  Discussed with him the potential options of going forward with surgery in the form of exploratory  laparotomy with a right colectomy with most likely primary anastomosis versus potential ostomy versus not doing anything but with this perforation and bowel obstruction, his clinical condition would only deteriorate and potentially evolving to sepsis.  The patient is very functional and does his own independent living and daily chores.  He is in favor of proceeding with surgery. - Patient will be admitted to surgical team will take him to the operating room tonight for exploratory laparotomy and right colectomy, possible ostomy.  Discussed with him the surgery at length including the planned incision, risks of bleeding, infection, injury to surrounding structures, hospital stay, postoperative activity restrictions, pain control, recovery process, and he is willing to proceed. - Patient reports that in the past he has been DNR he would like to remain as such.  More specifically, he would not want to do CPR or cardiac shocks but is in favor of having medications to increase his blood pressure.  He also would not want to be intubated for the sake of prolonging his life indeterminately unless there is something that can be reversed such as a pneumonia.  Discussed with patient that we will respect his wishes and will add the order for DNR in his chart. - Will take him to the operating room tonight.  Anesthesia team and OR team have been informed.  I spent 75 minutes dedicated to the care of this patient on the date of this encounter to include pre-visit review of records, face-to-face time with the patient discussing diagnosis and management, and any post-visit coordination of care.   Howie Ill, MD [facility]g:  [phone]

## 2023-03-19 ENCOUNTER — Encounter: Payer: Self-pay | Admitting: Surgery

## 2023-03-19 ENCOUNTER — Other Ambulatory Visit: Payer: Self-pay

## 2023-03-19 DIAGNOSIS — K631 Perforation of intestine (nontraumatic): Secondary | ICD-10-CM

## 2023-03-19 DIAGNOSIS — K668 Other specified disorders of peritoneum: Secondary | ICD-10-CM

## 2023-03-19 LAB — CBC
HCT: 35.5 % — ABNORMAL LOW (ref 39.0–52.0)
Hemoglobin: 11.6 g/dL — ABNORMAL LOW (ref 13.0–17.0)
MCH: 29.7 pg (ref 26.0–34.0)
MCHC: 32.7 g/dL (ref 30.0–36.0)
MCV: 90.8 fL (ref 80.0–100.0)
Platelets: 206 10*3/uL (ref 150–400)
RBC: 3.91 MIL/uL — ABNORMAL LOW (ref 4.22–5.81)
RDW: 13.3 % (ref 11.5–15.5)
WBC: 9.7 10*3/uL (ref 4.0–10.5)
nRBC: 0 % (ref 0.0–0.2)

## 2023-03-19 LAB — BASIC METABOLIC PANEL WITH GFR
Anion gap: 9 (ref 5–15)
BUN: 22 mg/dL (ref 8–23)
CO2: 21 mmol/L — ABNORMAL LOW (ref 22–32)
Calcium: 8.6 mg/dL — ABNORMAL LOW (ref 8.9–10.3)
Chloride: 109 mmol/L (ref 98–111)
Creatinine, Ser: 0.96 mg/dL (ref 0.61–1.24)
GFR, Estimated: >60 mL/min (ref >60)
Glucose, Bld: 188 mg/dL — ABNORMAL HIGH (ref 70–99)
Potassium: 3.7 mmol/L (ref 3.5–5.1)
Sodium: 139 mmol/L (ref 135–145)

## 2023-03-19 LAB — BPAM RBC
Blood Product Expiration Date: 202408302359
Blood Product Expiration Date: 202409022359
Unit Type and Rh: 5100
Unit Type and Rh: 5100

## 2023-03-19 LAB — ABO/RH: ABO/RH(D): O POS

## 2023-03-19 LAB — MAGNESIUM: Magnesium: 2 mg/dL (ref 1.7–2.4)

## 2023-03-19 MED ORDER — ORAL CARE MOUTH RINSE: 15.0000 mL | OROMUCOSAL | Status: DC | PRN

## 2023-03-19 MED ORDER — ENOXAPARIN SODIUM 40 MG/0.4ML IJ SOSY
40.0000 mg | PREFILLED_SYRINGE | INTRAMUSCULAR | Status: DC
  Administered 2023-03-19 – 2023-04-02 (×15): 40 mg via SUBCUTANEOUS
  Filled 2023-03-19 (×15): qty 0.4

## 2023-03-19 MED ORDER — ACETAMINOPHEN 10 MG/ML IV SOLN
1000.0000 mg | Freq: Four times a day (QID) | INTRAVENOUS | Status: AC
  Administered 2023-03-19 (×4): 1000 mg via INTRAVENOUS
  Filled 2023-03-19 (×4): qty 100

## 2023-03-19 MED ORDER — PHENOL 1.4 % MT LIQD
1.0000 | OROMUCOSAL | Status: DC | PRN
  Filled 2023-03-19: qty 177

## 2023-03-19 MED ORDER — FENTANYL CITRATE (PF) 100 MCG/2ML IJ SOLN
INTRAMUSCULAR | Status: AC
  Filled 2023-03-19: qty 2

## 2023-03-19 MED ORDER — SODIUM CHLORIDE 0.9 % IV SOLN: INTRAVENOUS | Status: DC | PRN

## 2023-03-19 NOTE — Anesthesia Postprocedure Evaluation (Signed)
Anesthesia Post Note  Patient: Don Arias  Procedure(s) Performed: EXPLORATORY LAPAROTOMY  POSS. RIGHT COLECTOMY  Patient location during evaluation: PACU Anesthesia Type: General Level of consciousness: awake and alert Pain management: pain level controlled Vital Signs Assessment: post-procedure vital signs reviewed and stable Respiratory status: spontaneous breathing, nonlabored ventilation, respiratory function stable and patient connected to nasal cannula oxygen Cardiovascular status: blood pressure returned to baseline and stable Postop Assessment: no apparent nausea or vomiting Anesthetic complications: no   No notable events documented.   Last Vitals:  Vitals:   03/19/23 0050 03/19/23 0100  BP:  127/77  Pulse: 88 93  Resp: 18 19  Temp: (!) 36.3 C   SpO2: 93% 95%    Last Pain:  Vitals:   03/19/23 0050  TempSrc:   PainSc: 4                  Lenard Simmer

## 2023-03-19 NOTE — Evaluation (Signed)
Physical Therapy Evaluation Patient Details Name: Don Arias MRN: [id number] DOB: 19-Jul-1930 Today's Date: 03/19/2023  History of Present Illness  Pt is a 89 yo M status post exploratory laparotomy and right hemicolectomy for right colon perforation likely from colonic obstruction.  PMH includes skin CA, HTN, and anxiety.   Clinical Impression  Pt was pleasant and motivated to participate during the session and put forth good effort throughout although ultimately was very limited by abdominal pain with movement.  Pt required heavy cuing for log roll technique and physical assistance to roll and to initiate sidelying to sit.  Pt unable to come all the way to full upright position during attempt to sit EOB secondary to abdominal pain, nursing aware.  Pt's SpO2 was 94% on room air throughout the session with HR WNL with no adverse symptoms noted other than abdominal pain.  Pt will benefit from continued PT services upon discharge to safely address deficits listed in patient problem list for decreased caregiver assistance and eventual return to PLOF.          Assistance Recommended at Discharge Frequent or constant Supervision/Assistance  If plan is discharge home, recommend the following:  Can travel by private vehicle  A lot of help with walking and/or transfers;A lot of help with bathing/dressing/bathroom;Assistance with cooking/housework;Direct supervision/assist for medications management;Help with stairs or ramp for entrance;Assist for transportation   No    Equipment Recommendations Other (comment) (TBD, pt has a walker but unsure of type)  Recommendations for Other Services       Functional Status Assessment Patient has had a recent decline in their functional status and demonstrates the ability to make significant improvements in function in a reasonable and predictable amount of time.     Precautions / Restrictions Precautions Precautions: Fall Restrictions Weight  Bearing Restrictions: No Other Position/Activity Restrictions: Abdominal surgery with LLQ JP drain and NG tube      Mobility  Bed Mobility Overal bed mobility: Needs Assistance Bed Mobility: Rolling, Sidelying to Sit Rolling: Min assist Sidelying to sit: Mod assist       General bed mobility comments: Pt required mod to max verbal and tactile cues for sequencing for log roll training with min A to roll and mod A to come partially to sitting from sidelying, unable to come fully to sitting secondary to abdominal pain    Transfers                   General transfer comment: Unable to assess    Ambulation/Gait                  Stairs            Wheelchair Mobility     Tilt Bed    Modified Rankin (Stroke Patients Only)       Balance                                             Pertinent Vitals/Pain Pain Assessment Pain Assessment: Faces Pain Score: 3  Faces Pain Scale:  ("Between 3 and 4") Pain Location: Abdomen Pain Descriptors / Indicators: Sore Pain Intervention(s): Monitored during session, Limited activity within patient's tolerance, Patient requesting pain meds-RN notified, RN gave pain meds during session    Home Living Family/patient expects to be discharged to:: Private residence Living Arrangements: Alone Available Help at Discharge: Family;Available  24 hours/day Type of Home: House Home Access: Ramped entrance;Stairs to enter Entrance Stairs-Rails: Left Entrance Stairs-Number of Steps: 3, also has a ramp   Home Layout: One level Home Equipment: Grab bars - toilet;Cane - single point;BSC/3in1      Prior Function Prior Level of Function : Independent/Modified Independent             Mobility Comments: Ind amb community distances without an AD, no fall history ADLs Comments: Ind with ADLs     Hand Dominance        Extremity/Trunk Assessment   Upper Extremity Assessment Upper Extremity Assessment:  Generalized weakness    Lower Extremity Assessment Lower Extremity Assessment: Generalized weakness       Communication   Communication: No difficulties  Cognition Arousal/Alertness: Awake/alert Behavior During Therapy: WFL for tasks assessed/performed Overall Cognitive Status: Within Functional Limits for tasks assessed                                          General Comments      Exercises Other Exercises Other Exercises: Log roll sequencing training Other Exercises: Pt education provided on physiological benefits of activity   Assessment/Plan    PT Assessment Patient needs continued PT services  PT Problem List Decreased strength;Decreased activity tolerance;Decreased mobility;Decreased knowledge of use of DME;Decreased knowledge of precautions       PT Treatment Interventions Gait training;Stair training;Functional mobility training;Therapeutic activities;Therapeutic exercise;DME instruction;Balance training;Patient/family education    PT Goals (Current goals can be found in the Care Plan section)  Acute Rehab PT Goals Patient Stated Goal: To mow my yard and be independent PT Goal Formulation: With patient Time For Goal Achievement: 04/01/23 Potential to Achieve Goals: Good    Frequency Min 1X/week     Co-evaluation               AM-PAC PT "6 Clicks" Mobility  Outcome Measure Help needed turning from your back to your side while in a flat bed without using bedrails?: A Little Help needed moving from lying on your back to sitting on the side of a flat bed without using bedrails?: A Lot Help needed moving to and from a bed to a chair (including a wheelchair)?: A Lot Help needed standing up from a chair using your arms (e.g., wheelchair or bedside chair)?: A Lot Help needed to walk in hospital room?: Total Help needed climbing 3-5 steps with a railing? : Total 6 Click Score: 11    End of Session   Activity Tolerance: Patient limited  by pain Patient left: in bed;with call bell/phone within reach;with bed alarm set;with nursing/sitter in room;with family/visitor present Nurse Communication: Mobility status PT Visit Diagnosis: Difficulty in walking, not elsewhere classified (R26.2);Muscle weakness (generalized) (M62.81);Pain Pain - part of body:  (abdomen)    Time: 1191-4782 PT Time Calculation (min) (ACUTE ONLY): 47 min   Charges:   PT Evaluation $PT Eval Moderate Complexity: 1 Mod PT Treatments $Therapeutic Activity: 8-22 mins PT General Charges $$ ACUTE PT VISIT: 1 Visit        D. Elly Modena PT, DPT 03/19/23, 3:49 PM

## 2023-03-19 NOTE — Plan of Care (Signed)
Patient admitted to the floor s/p exp lap. Patient reports pain is well managed with medication. Vital signs are stable. NGT to low suction and no output noted. Incision is wnl and dressing is dry and intact. Nursing will continue to monitor patient status.

## 2023-03-19 NOTE — Op Note (Signed)
Procedure Date:  03/19/2023  Pre-operative Diagnosis:  Pneumoperitoneum, right colon obstruction  Post-operative Diagnosis:  Right colon obstruction with right colon perforation.  Procedure:  Exploratory Laparotomy, right colectomy with ileocolonic anastomosis  Surgeon:  Howie Ill, MD  Assistant:  Mena Goes, RNFA.  Anesthesia:  General endotracheal  Estimated Blood Loss:  100 ml  Specimens:  Right colon  Complications:  None  Indications for Procedure:  This is a 89 y.o. male who presents with abdominal pain and peritonitis and workup revealing pneumoperitoneum with also evidence of right colon obstruction, possibly due to a mass in the right colon.  The risks of bleeding, abscess or infection, injury to surrounding structures, and need for further procedures were all discussed with the patient and was willing to proceed.  Description of Procedure: The patient was correctly identified in the preoperative area and brought into the operating room.  The patient was placed supine with VTE prophylaxis in place.  Appropriate time-outs were performed.  Anesthesia was induced and the patient was intubated.  Foley catheter was placed.  Appropriate antibiotics were infused.  The abdomen was prepped and draped in a sterile fashion.  A midline incision was made and electrocautery was used to dissect down the subcutaneous tissue to the fascia.  The fascia was incised and extended superiorly and inferiorly.  Upon entering the abdomen (organ space), I encountered feculent peritonitis with stool leaking from the area of perforation in the right colon.  This area was necrotic, measuring about 3 x 1 cm in size.  After finding the area of perforation and trying to clear some of the surrounding stool, the perforation was closed with 3-0 Silk sutures to minimize further contamination.  The abdomen was irrigated as well with 1 L saline.  Further exploration of the abdomen including the rest of the  colon, small bowel, and stomach did not reveal any other perforations or injuries.  NG tube was confirmed within the stomach with palpation.  We started with our right colectomy.  We started mobilizing the terminal ileum by freeing its attachments to the lateral pelvic wall.  Then proceeding distally to the cecum and right colon along the White line of Toldt, proceedig to the hepatic flexure and proximal transverse colon.  The colon was dissected in lateral to medial fashion.  Once on the transverse colon, out point of transection was selected and the epiploic fat was dissected off using cautery.  The omentum at this point was dissected off the colon as well entering the lesser sac.  There were areas of omentum that was thickly adhered to the right colon at th perforation area and the omentum here was transected to help with mobilization of the omentum.  This was continued proximally to the hepatic flexure until the colon itself was fully free.  During mobilization the duodenum was observed and noted to be intact.  The ureter and gonadal vessels were not observed but our dissection field remained anterior to them.  While mobilizing the hepatic flexure, there was an area of bleeding from the mesentery which was controlled with cautery.  Then the terminal ileum and transverse colon were transected using blue load 75 mm GIA staplers, and the mesentery was taken down using LigaSure.  This all was sent as one specimen to pathology.  Following that, the dissection field was again evaluated and any small oozing was controlled with cautery.  The distal ileum and transverse colon were lined up side to side using 3-0 Silk sutures and after  creating enterotomies, another GIA blue load was used to creat our common channel and then another load to close the enterotomy.  3-0 Silk sutures were used to imbricate the staple line and protect the crotch of the anastomosis.  The mesenteric defect was large enough that was left alone.   Part of the wall of the colon had remained stuck to the right side abdominal wall.  This is likely the area that was necrotic and had perforated.  This was excised off the abdominal wall and sent as a separate specimen.  The fascial defect created was closed with multiple 2-0 vicryl sutures.  The abdomen was thoroughly irrigated in all quadrants with 3 L of warm normal saline, followed by 1 L of Vashe solution focused in the right abdomen.  3 gm of Arista powder was sprayed over the dissection surface for further hemostasis.  A 19 Fr Blake drain was placed via the left lower quadrant going to the pelvis and ending in the right upper quadrant next to the anastomosis.  Exparel solution mixed with 0.5% bupivacaine with epi was infiltrated over the peritoneum, fascia, and subcutaneous tissue.  The fascia was then closed using #1 PDS sutures.  The midline wound was irrigated and closed using staples with a 1/4 inch penrose drain at the base of the midline wound.  The Blake drain was secured using 3-0 nylon suture.  The wound was dressed with Honeycomb dressing and the drain with 4x4 gauze and tegaderm.   The patient was emerged from anesthesia and extubated and brought to the recovery room for further management.  The patient tolerated the procedure well and all counts were correct at the end of the case.  Please note that Ms. Judith Blonder was scrubbed in for the entirety of the surgery.  Her assistance was critical due to the complexity of the case, and she assisted with exposure, mobilization, resection, anastomosis, and closure.  Howie Ill, MD

## 2023-03-19 NOTE — Transfer of Care (Signed)
Immediate Anesthesia Transfer of Care Note  Patient: Don Arias  Procedure(s) Performed: EXPLORATORY LAPAROTOMY  POSS. RIGHT COLECTOMY  Patient Location: PACU  Anesthesia Type:General  Level of Consciousness: drowsy  Airway & Oxygen Therapy: Patient Spontanous Breathing and Patient connected to face mask oxygen  Post-op Assessment: Report given to RN  Post vital signs: stable  Last Vitals:  Vitals Value Taken Time  BP 150/78 03/19/23 0006  Temp    Pulse 94 03/19/23 0008  Resp 23 03/19/23 0008  SpO2 96 % 03/19/23 0008  Vitals shown include unfiled device data.  Last Pain:  Vitals:   03/18/23 1949  TempSrc: Oral  PainSc:          Complications: No notable events documented.

## 2023-03-19 NOTE — Progress Notes (Signed)
03/19/2023  Subjective: Patient is 1 Day Post-Op status post exploratory laparotomy and right hemicolectomy for right colon perforation likely from colonic obstruction.  No acute events overnight.  Patient reports that he feels much better than yesterday.  WBC remains normal at 9.7 creatinine stable still within normal range.  Vital signs: Temp:  [97.2 F (36.2 C)-98.6 F (37 C)] 97.2 F (36.2 C) (07/28 0814) Pulse Rate:  [75-96] 75 (07/28 0814) Resp:  [14-24] 16 (07/28 0814) BP: (117-184)/(71-113) 117/74 (07/28 0814) SpO2:  [89 %-98 %] 96 % (07/28 0814) Weight:  [81.6 kg] 81.6 kg (07/27 1609)   Intake/Output: 07/27 0701 - 07/28 0700 In: 2800 [I.V.:1600; IV Piggyback:1200] Out: 890 [Urine:700; Drains:90; Blood:100]    Physical Exam: Constitutional: No acute distress Abdomen: Soft, nondistended, appropriately sore to palpation.  Midline incision is clean, dry, intact with Penrose drain in the middle and staples.  Left-sided Blake drain with serosanguineous fluid.  Labs:  Recent Labs    03/18/23 1626 03/19/23 0425  WBC 9.9 9.7  HGB 13.3 11.6*  HCT 41.1 35.5*  PLT 248 206   Recent Labs    03/18/23 1626 03/19/23 0425  NA 138 139  K 3.3* 3.7  CL 107 109  CO2 23 21*  GLUCOSE 128* 188*  BUN 25* 22  CREATININE 1.06 0.96  CALCIUM 9.3 8.6*   No results for input(s): "LABPROT", "INR" in the last 72 hours.  Imaging: CT ABDOMEN PELVIS W CONTRAST  Result Date: 03/18/2023 CLINICAL DATA:  Right-sided abdominal pain and distention. EXAM: CT ABDOMEN AND PELVIS WITH CONTRAST TECHNIQUE: Multidetector CT imaging of the abdomen and pelvis was performed using the standard protocol following bolus administration of intravenous contrast. RADIATION DOSE REDUCTION: This exam was performed according to the departmental dose-optimization program which includes automated exposure control, adjustment of the mA and/or kV according to patient size and/or use of iterative reconstruction technique.  CONTRAST:  OMNIPAQUE IOHEXOL 300 MG/ML  SOLN COMPARISON:  CT 2011 FINDINGS: Lower chest: Mild linear opacity lung bases likely scar or atelectasis. No pleural effusion. Coronary artery calcifications are seen. Significant calcifications in the area of the aortic valve. Hepatobiliary: Low-attenuation segment 7 liver lesion seen measuring 2.6 by 2.2 cm. Gallbladder is nondilated. Patent portal vein. Pancreas: Mild atrophy of the pancreas. Several punctate calcifications are seen. Please correlate for any history of chronic calcific pancreatitis. Spleen: Normal in size without focal abnormality. Adrenals/Urinary Tract: Adrenal glands are preserved. Moderate atrophy of the left adrenal gland with a focal area towards the upper pole laterally. Punctate nonobstructing lower pole left-sided renal stone. No left-sided renal collecting system dilatation. There is a 12 mm stone in the bladder along the left side near the margin of the UVJ. The bladder is underdistended with slight wall thickening. Right kidney is without enhancing mass or collecting system dilatation. The right ureter has a normal course and caliber down to the bladder. Stomach/Bowel: No oral contrast. Scattered stool. Sigmoid colon diverticula. There is dilatation of the cecum with diameter approaching 10 cm with significant luminal debris in stool. There is possible mass lesion along the ascending colon distal to the ileocecal valve as seen on axial image 50, coronal image 44. This has at the level of caliber change along the colon. A developing mass lesion or neoplasm is possible. Stomach and small bowel is nondilated. Vascular/Lymphatic: There is a infrarenal abdominal aortic aneurysm identified extending posterior. There is some wispy enhancement along the area of mural plaque and thrombus in the aneurysm which overall  has diameter of 4.1 x 4.2 cm. This has centered just above the aortic bifurcation. Normal caliber IVC. No specific abnormal  retroperitoneal lymph node enlargement however there are some prominent mesenteric nodes on the right side anteriorly such as axial series 2, image 42, 44. These measure up to 11 mm in size and has a rounded, abnormal morphology. In addition there is a rounded area seen immediately posterior to the SMV on series 40, image 2 measuring 13 mm. This appears to follow the density of vasculature on the 2 phases obtained. This actually was present on the study of 2011 and may be a small venous varix or other vascular lesion. Reproductive: Enlarged prostate with heterogeneous enhancement posteriorly on the left. Please correlate with patient's PSA. Other: Scattered free air identified. Etiology of which is uncertain. Trace free fluid in the right pericolic gutter. Musculoskeletal: Scattered degenerative changes of the spine and pelvis. Transitional lumbosacral segment. Curvature of the spine. Critical Value/emergent results were called by telephone at the time of interpretation on 03/18/2023 at 3:25 pm to provider [facility] , who verbally acknowledged these results. IMPRESSION: Free intra-abdominal air. Bowel perforation until proven otherwise. The exact etiology of the free air can not be determined on this examination. There is a mass lesion identified along the ascending colon with focal caliber change of the bowel worrisome for potential neoplasm. There also some abnormal nodes in the right hemi abdominal mesentery and possible liver metastasis. Further workup when appropriate. This could be the source of the free air but indeterminate. There is some fluid adjacent to the ascending colon in the pericolic gutter. Bilateral renal atrophy. Punctate nonobstructing left-sided renal stone. Left-sided bladder stone. Enlarged prostate with heterogeneous enhancement. Please correlate with patient's PSA. Fusiform 4.2 cm infrarenal abdominal aortic aneurysm with some enhancement along the mural plaque posteriorly. Recommend  follow-up every 12 months and vascular consultation. This recommendation follows ACR consensus guidelines: White Paper of the ACR Incidental Findings Committee II on Vascular Findings. J Am Coll Radiol 2013; 10:789-794. Electronically Signed   By: Karen Kays M.D.   On: 03/18/2023 18:26    Assessment/Plan: This is a 89 y.o. male s/p exploratory laparotomy and right hemicolectomy.  - Patient is doing well so far today.  No bowel function yet. - Continue IV antibiotics for his bowel perforation. - Continue n.p.o., NG tube to suction while awaiting for return of bowel function. - Okay to start DVT prophylaxis tonight. - Will place PT consult to help with ambulation and deconditioning.   Howie Ill, MD [facility]

## 2023-03-19 NOTE — Brief Op Note (Signed)
03/18/2023  12:42 AM  PATIENT:  Don Arias  89 y.o. male  PRE-OPERATIVE DIAGNOSIS:  Bowel perforation, right colon obstruction  POST-OPERATIVE DIAGNOSIS:  Bowel perforation, right colon obstruction  PROCEDURE:  Procedure(s): EXPLORATORY LAPAROTOMY, RIGHT COLECTOMY  SURGEON:  Surgeons and Role:    * Henrene Dodge, MD - Primary  ASSISTANTS: Mena Goes, RNFA   ANESTHESIA:   general  EBL:  100 mL   BLOOD ADMINISTERED:none  DRAINS: Penrose drain in the midline wound and (19 Fr) Blake drain(s) in the pelvis and RUQ.    LOCAL MEDICATIONS USED:  BUPIVICAINE   SPECIMEN:  Source of Specimen:  Right colon  DISPOSITION OF SPECIMEN:  PATHOLOGY  COUNTS:  YES  TOURNIQUET:  * No tourniquets in log *  DICTATION: .Dragon Dictation  PLAN OF CARE: Admit to inpatient   PATIENT DISPOSITION:  PACU - hemodynamically stable.   Delay start of Pharmacological VTE agent (>24hrs) due to surgical blood loss or risk of bleeding: yes

## 2023-03-20 ENCOUNTER — Encounter: Payer: Self-pay | Admitting: Surgery

## 2023-03-20 MED ORDER — SODIUM CHLORIDE 0.9 % IV SOLN: INTRAVENOUS | Status: AC

## 2023-03-20 MED ORDER — HYDROMORPHONE HCL 1 MG/ML IJ SOLN
0.5000 mg | INTRAMUSCULAR | Status: DC | PRN
  Administered 2023-03-20 – 2023-03-30 (×11): 0.5 mg via INTRAVENOUS
  Filled 2023-03-20 (×12): qty 0.5

## 2023-03-20 MED ORDER — ACETAMINOPHEN 10 MG/ML IV SOLN
1000.0000 mg | Freq: Four times a day (QID) | INTRAVENOUS | Status: AC
  Administered 2023-03-20 – 2023-03-21 (×3): 1000 mg via INTRAVENOUS
  Filled 2023-03-20 (×4): qty 100

## 2023-03-20 NOTE — TOC Initial Note (Signed)
Transition of Care [facility]) - Initial/Assessment Note    Patient Details  Name: Don Arias MRN: [id number] Date of Birth: 11/28/1929  Transition of Care [facility]) CM/SW Contact:    Margarito Liner, LCSW Phone Number: 03/20/2023, 11:06 AM  Clinical Narrative: CSW met with patient. Daughter, Eunice Blase, at bedside. CSW introduced role and explained that PT recommendations would be discussed. Patient is unsure if he wants SNF placement. CSW will continue to follow progress to determine if he improves enough to return home. Patient still with NG tube. No further concerns. CSW encouraged patient and his daughter to contact CSW as needed. CSW will continue to follow patient and his daughter for support and facilitate discharge once medically stable.                 Expected Discharge Plan: Skilled Nursing Facility Barriers to Discharge: Continued Medical Work up   Patient Goals and CMS Choice   CMS Medicare.gov Compare Post Acute Care list provided to:: Patient (Daughter at bedside)        Expected Discharge Plan and Services     Post Acute Care Choice:  (TBD) Living arrangements for the past 2 months: Single Family Home                                      Prior Living Arrangements/Services Living arrangements for the past 2 months: Single Family Home Lives with:: Self Patient language and need for interpreter reviewed:: Yes Do you feel safe going back to the place where you live?: Yes      Need for Family Participation in Patient Care: Yes (Comment) Care giver support system in place?: Yes (comment)   Criminal Activity/Legal Involvement Pertinent to Current Situation/Hospitalization: No - Comment as needed  Activities of Daily Living Home Assistive Devices/Equipment: None ADL Screening (condition at time of admission) Patient's cognitive ability adequate to safely complete daily activities?: Yes Is the patient deaf or have difficulty hearing?: Yes Does the patient have  difficulty seeing, even when wearing glasses/contacts?: No Does the patient have difficulty concentrating, remembering, or making decisions?: No Patient able to express need for assistance with ADLs?: Yes Does the patient have difficulty dressing or bathing?: No Independently performs ADLs?: Yes (appropriate for developmental age) Does the patient have difficulty walking or climbing stairs?: No Weakness of Legs: None Weakness of Arms/Hands: None  Permission Sought/Granted Permission sought to share information with : Family Supports Permission granted to share information with : Yes, Verbal Permission Granted  Share Information with NAME: Tressia Miners     Permission granted to share info w Relationship: Daughter  Permission granted to share info w Contact Information: [phone]  Emotional Assessment Appearance:: Appears stated age Attitude/Demeanor/Rapport: Engaged Affect (typically observed): Appropriate, Calm, Pleasant Orientation: : Oriented to Self, Oriented to Place, Oriented to  Time, Oriented to Situation Alcohol / Substance Use: Not Applicable Psych Involvement: No (comment)  Admission diagnosis:  Pneumoperitoneum [K66.8] Patient Active Problem List   Diagnosis Date Noted   Colon perforation (HCC) 03/19/2023   Pneumoperitoneum 03/18/2023   GERD (gastroesophageal reflux disease) 04/16/2021   Panic disorder 04/16/2021   Grief 04/16/2021   PCP:  [facility]:   [id number] - [place], [place] - [address] [place] [address] Phone: [phone] Fax: [phone]     Social Determinants of Health (SDOH) Social History: SDOH Screenings   Food  Insecurity: No Food Insecurity (03/19/2023)  Housing: Low Risk  (03/19/2023)  Transportation Needs: No Transportation Needs (03/19/2023)  Utilities: Not At Risk (03/19/2023)  Tobacco Use: Medium Risk (03/18/2023)   SDOH Interventions:     Readmission Risk Interventions     No  data to display

## 2023-03-20 NOTE — Progress Notes (Signed)
Physical Therapy Treatment Patient Details Name: Don Arias MRN: [id number] DOB: 07/04/1930 Today's Date: 03/20/2023   History of Present Illness Pt is a 89 yo M status post exploratory laparotomy and right hemicolectomy for right colon perforation likely from colonic obstruction.  PMH includes skin CA, HTN, and anxiety.    PT Comments  Therapy timed around pain medication. Increased activity tolerance this session and patient reports pain is much better controlled during mobility compared to prior session. The patient was able to sit edge of of bed for several minutes and stand with assistance using rolling walker. He took side steps to the left with rolling walker with assistance with standing tolerance of around a minute. The patient expressed he would like to return home at discharge and is not interested in going to rehab. Recommend to continue PT to maximize independence and to decrease caregiver burden.     If plan is discharge home, recommend the following: A lot of help with walking and/or transfers;A lot of help with bathing/dressing/bathroom;Assistance with cooking/housework;Direct supervision/assist for medications management;Help with stairs or ramp for entrance;Assist for transportation   Can travel by private vehicle     No  Equipment Recommendations  Rolling walker (2 wheels)    Recommendations for Other Services       Precautions / Restrictions Precautions Precautions: Fall Restrictions Weight Bearing Restrictions: No Other Position/Activity Restrictions: Abdominal surgery with LLQ JP drain and NG tube     Mobility  Bed Mobility Overal bed mobility: Needs Assistance Bed Mobility: Rolling, Sidelying to Sit, Sit to Sidelying Rolling: Min assist Sidelying to sit: Min assist, +2 for physical assistance     Sit to sidelying: Min assist, +2 for physical assistance General bed mobility comments: verbal cues for logroll technique for comfort and to protect  abdominal incision. increased time required    Transfers Overall transfer level: Needs assistance Equipment used: Rolling walker (2 wheels) Transfers: Sit to/from Stand Sit to Stand: Min assist, +2 physical assistance, From elevated surface           General transfer comment: verbal cues for hand placement and safety. increased time and effort required    Ambulation/Gait             Pre-gait activities: patient able to take 3-4 small side steps to the left with rolling walker with Min A+2 person. standing activity tolerance and mild dizziness limit ambulation efforts at this time. standing tolerance of at least 1 minute     Stairs             Wheelchair Mobility     Tilt Bed    Modified Rankin (Stroke Patients Only)       Balance                                            Cognition Arousal/Alertness: Awake/alert Behavior During Therapy: WFL for tasks assessed/performed Overall Cognitive Status: Within Functional Limits for tasks assessed                                          Exercises      General Comments General comments (skin integrity, edema, etc.): patient educated on general precuations with mobility following sugery as well as expectations with mobility and progression of mobility with  therapy. patient was pleased on how therapy session was today and pain control with mobility      Pertinent Vitals/Pain Pain Assessment Pain Assessment: Faces Faces Pain Scale: Hurts even more Pain Location: Abdomen Pain Descriptors / Indicators: Sore Pain Intervention(s): Limited activity within patient's tolerance, Monitored during session, Premedicated before session, Repositioned    Home Living                          Prior Function            PT Goals (current goals can now be found in the care plan section) Acute Rehab PT Goals Patient Stated Goal: to have pain control and return home PT Goal  Formulation: With patient Time For Goal Achievement: 04/01/23 Potential to Achieve Goals: Good Progress towards PT goals: Progressing toward goals    Frequency    Min 1X/week      PT Plan Current plan remains appropriate    Co-evaluation              AM-PAC PT "6 Clicks" Mobility   Outcome Measure  Help needed turning from your back to your side while in a flat bed without using bedrails?: A Little Help needed moving from lying on your back to sitting on the side of a flat bed without using bedrails?: A Lot Help needed moving to and from a bed to a chair (including a wheelchair)?: A Lot Help needed standing up from a chair using your arms (e.g., wheelchair or bedside chair)?: A Lot Help needed to walk in hospital room?: Total Help needed climbing 3-5 steps with a railing? : Total 6 Click Score: 11    End of Session   Activity Tolerance: Patient tolerated treatment well Patient left: in bed;with call bell/phone within reach;with bed alarm set;with family/visitor present Nurse Communication: Mobility status (IV was out of the right hand (noted during therapy session). OT alerted nurse via secure chat group message.) PT Visit Diagnosis: Difficulty in walking, not elsewhere classified (R26.2);Muscle weakness (generalized) (M62.81);Pain     Time: 1216-1303 PT Time Calculation (min) (ACUTE ONLY): 47 min  Charges:    $Therapeutic Activity: 23-37 mins PT General Charges $$ ACUTE PT VISIT: 1 Visit                     Donna Bernard, PT, MPT    In[facility] 03/20/2023, 1:54 PM

## 2023-03-20 NOTE — Evaluation (Signed)
Occupational Therapy Evaluation Patient Details Name: Don Arias MRN: [id number] DOB: 02/09/1930 Today's Date: 03/20/2023   History of Present Illness Pt is a 89 yo M status post exploratory laparotomy and right hemicolectomy for right colon perforation likely from colonic obstruction.  PMH includes skin CA, HTN, and anxiety.   Clinical Impression   Pt was seen for OT evaluation this date. Prior to hospital admission, pt was generally independent, however, endorsing increasing pain over past several weeks requiring him to take his time for all aspects of ADL and mobility. Pt lives by himself but does endorse that he will have 24/7 assist upon discharge and very eager to return home. Pt presents to acute OT demonstrating impaired ADL performance and functional mobility 2/2 abdominal/incisional pain, decreased strength, and activity tolerance (See OT problem list for additional functional deficits). Pt currently requires MIN A +2 for bed mobility and STS transfers with RW and heavy cueing for sequencing and MOD-MAX A for LB ADL Tasks. Pt instructed in bracing techniques for sneezing/coughing, guidelines for movement to minimize abdominal/incisional pain, falls prevention. Pt/dtr verbalized understanding. Pt would benefit from skilled OT services to address noted impairments and functional limitations (see below for any additional details) in order to maximize safety and independence while minimizing falls risk and caregiver burden.    Recommendations for follow up therapy are one component of a multi-disciplinary discharge planning process, led by the attending physician.  Recommendations may be updated based on patient status, additional functional criteria and insurance authorization.   Assistance Recommended at Discharge Frequent or constant Supervision/Assistance  Patient can return home with the following A lot of help with walking and/or transfers;A lot of help with  bathing/dressing/bathroom;Assist for transportation;Assistance with cooking/housework;Help with stairs or ramp for entrance    Functional Status Assessment  Patient has had a recent decline in their functional status and demonstrates the ability to make significant improvements in function in a reasonable and predictable amount of time.  Equipment Recommendations  Other (comment) (2WW)    Recommendations for Other Services       Precautions / Restrictions Precautions Precautions: Fall Restrictions Weight Bearing Restrictions: No Other Position/Activity Restrictions: Abdominal surgery with LLQ JP drain and NG tube      Mobility Bed Mobility Overal bed mobility: Needs Assistance Bed Mobility: Rolling, Sidelying to Sit, Sit to Sidelying Rolling: Min assist Sidelying to sit: Min assist, +2 for physical assistance     Sit to sidelying: Min assist, +2 for physical assistance General bed mobility comments: verbal cues for logroll technique for comfort and to protect abdominal incision. increased time required    Transfers Overall transfer level: Needs assistance Equipment used: Rolling walker (2 wheels) Transfers: Sit to/from Stand Sit to Stand: Min assist, +2 physical assistance, From elevated surface           General transfer comment: verbal cues for hand placement and safety. increased time and effort required      Balance Overall balance assessment: Needs assistance Sitting-balance support: Bilateral upper extremity supported, Single extremity supported, Feet supported Sitting balance-Leahy Scale: Fair     Standing balance support: Reliant on assistive device for balance Standing balance-Leahy Scale: Fair                             ADL either performed or assessed with clinical judgement   ADL Overall ADL's : Needs assistance/impaired  General ADL Comments: Pt currently requires MOD-MAX A for  LB ADL tasks 2/2 pain with attempts at bending, MIN A +2 for STS with RW.     Vision         Perception     Praxis      Pertinent Vitals/Pain Pain Assessment Pain Assessment: 0-10 Pain Score: 7  Pain Location: Abdomen Pain Descriptors / Indicators: Sore, Guarding Pain Intervention(s): Limited activity within patient's tolerance, Monitored during session, Premedicated before session, Repositioned     Hand Dominance     Extremity/Trunk Assessment Upper Extremity Assessment Upper Extremity Assessment: Generalized weakness   Lower Extremity Assessment Lower Extremity Assessment: Generalized weakness       Communication Communication Communication: No difficulties   Cognition Arousal/Alertness: Awake/alert Behavior During Therapy: WFL for tasks assessed/performed, Anxious Overall Cognitive Status: Within Functional Limits for tasks assessed                                 General Comments: a bit anxious of movement 2/2 pain     General Comments  R hand IV that RN attempted to secure with tape prior to movement was noted to be dislodged from hand. RN notified promptly.    Exercises Other Exercises Other Exercises: Pt instructed in bracing techniques for sneezing/coughing, guidelines for movement to minimize abdominal/incisional pain, falls prevention   Shoulder Instructions      Home Living Family/patient expects to be discharged to:: Private residence Living Arrangements: Alone Available Help at Discharge: Family;Available 24 hours/day Type of Home: House Home Access: Ramped entrance;Stairs to enter Entrance Stairs-Number of Steps: 3, also has a ramp Entrance Stairs-Rails: Left Home Layout: One level     Bathroom Shower/Tub: Chief Strategy Officer: Standard     Home Equipment: Grab bars - toilet;Cane - single point;BSC/3in1          Prior Functioning/Environment Prior Level of Function : Independent/Modified Independent              Mobility Comments: Ind amb community distances without an AD, no fall history ADLs Comments: Ind with ADLs        OT Problem List: Decreased strength;Pain;Decreased safety awareness;Decreased activity tolerance;Decreased knowledge of use of DME or AE;Impaired balance (sitting and/or standing)      OT Treatment/Interventions: Self-care/ADL training;Therapeutic exercise;Therapeutic activities;DME and/or AE instruction;Patient/family education;Balance training    OT Goals(Current goals can be found in the care plan section) Acute Rehab OT Goals Patient Stated Goal: go home OT Goal Formulation: With patient/family Time For Goal Achievement: 04/03/23 Potential to Achieve Goals: Good ADL Goals Pt Will Perform Grooming: with set-up;sitting;with modified independence Pt Will Perform Lower Body Dressing: with adaptive equipment;sit to/from stand;with supervision;with set-up Pt Will Transfer to Toilet: ambulating;bedside commode;with supervision (LRAD) Pt Will Perform Toileting - Clothing Manipulation and hygiene: with modified independence  OT Frequency: Min 1X/week    Co-evaluation PT/OT/SLP Co-Evaluation/Treatment: Yes Reason for Co-Treatment: To address functional/ADL transfers;Complexity of the patient's impairments (multi-system involvement) PT goals addressed during session: Mobility/safety with mobility OT goals addressed during session: ADL's and self-care      AM-PAC OT "6 Clicks" Daily Activity     Outcome Measure Help from another person eating meals?: None Help from another person taking care of personal grooming?: A Little Help from another person toileting, which includes using toliet, bedpan, or urinal?: A Lot Help from another person bathing (including washing, rinsing, drying)?: A Lot Help from another  person to put on and taking off regular upper body clothing?: A Little Help from another person to put on and taking off regular lower body clothing?: A  Lot 6 Click Score: 16   End of Session Equipment Utilized During Treatment: Rolling walker (2 wheels) Nurse Communication: Other (comment) (R hand IV)  Activity Tolerance: Patient tolerated treatment well Patient left: in bed;with call bell/phone within reach;with bed alarm set;with family/visitor present  OT Visit Diagnosis: Other abnormalities of gait and mobility (R26.89);Muscle weakness (generalized) (M62.81);Pain Pain - Right/Left:  (abdomen)                Time: 8295-6213 OT Time Calculation (min): 47 min Charges:  OT General Charges $OT Visit: 1 Visit OT Evaluation $OT Eval Moderate Complexity: 1 Mod OT Treatments $Self Care/Home Management : 8-22 mins  Arman Filter., MPH, MS, OTR/L ascom [phone] 03/20/23, 2:37 PM

## 2023-03-20 NOTE — Progress Notes (Addendum)
[facility] SURGICAL PROGRESS NOTE  Hospital Day(s): 2.   Post op day(s): 2 Days Post-Op.   Interval History:  Patient seen and examined No acute events or new complaints overnight.  Patient reports increased abdominal soreness; especially working with therapies No fever, chills, nausea, emesis He remains without leukocytosis; 8.9K Hgb to 10.8 AKI; sCr - 1.35; UO - 500 ccs No significant electrolyte derangements  NGT with 150 ccs out Surgical drain with 60 ccs in last 24 hours; serosanguinous He is NPO No flatus   Vital signs in last 24 hours: [min-max] current  Temp:  [97.2 F (36.2 C)-98.6 F (37 C)] 98.6 F (37 C) (07/29 0748) Pulse Rate:  [73-81] 80 (07/29 0748) Resp:  [16-20] 18 (07/29 0748) BP: (108-157)/(65-92) 157/92 (07/29 0748) SpO2:  [92 %-97 %] 92 % (07/29 0748)     Height: 5\' 10"  (177.8 cm) Weight: 81.6 kg BMI (Calculated): 25.83   Intake/Output last 2 shifts:  07/28 0701 - 07/29 0700 In: 1255.1 [I.V.:794.7; IV Piggyback:460.4] Out: 710 [Urine:500; Emesis/NG output:150; Drains:60]   Physical Exam:  Constitutional: alert, cooperative and no distress  HEENT: NGT in place; output slightly bilious  Respiratory: breathing non-labored at rest  Cardiovascular: regular rate and sinus rhythm  Gastrointestinal: Soft, incisional soreness, non-distended, no rebound/guarding. Surgical drain in LLQ; output serosanguinous  Integumentary: Laparotomy is intact with staples, penrose in place, there is serosanguinous drainage to dependent portion.   Labs:     Latest Ref Rng & Units 03/20/2023    4:50 AM 03/19/2023    4:25 AM 03/18/2023    4:26 PM  CBC  WBC 4.0 - 10.5 K/uL 8.9  9.7  9.9   Hemoglobin 13.0 - 17.0 g/dL 42.5  95.6  38.7   Hematocrit 39.0 - 52.0 % 33.9  35.5  41.1   Platelets 150 - 400 K/uL 190  206  248       Latest Ref Rng & Units 03/20/2023    4:50 AM 03/19/2023    4:25 AM 03/18/2023    4:26 PM  CMP  Glucose 70 - 99 mg/dL 564  332  951    BUN 8 - 23 mg/dL 26  22  25    Creatinine 0.61 - 1.24 mg/dL 8.84  1.66  0.63   Sodium 135 - 145 mmol/L 140  139  138   Potassium 3.5 - 5.1 mmol/L 4.5  3.7  3.3   Chloride 98 - 111 mmol/L 108  109  107   CO2 22 - 32 mmol/L 24  21  23    Calcium 8.9 - 10.3 mg/dL 8.8  8.6  9.3   Total Protein 6.5 - 8.1 g/dL   6.2   Total Bilirubin 0.3 - 1.2 mg/dL   0.5   Alkaline Phos 38 - 126 U/L   58   AST 15 - 41 U/L   17   ALT 0 - 44 U/L   15      Imaging studies: No new pertinent imaging studies   Assessment/Plan:  89 y.o. male with AKI, otherwise awaiting ROBF, 2 Days Post-Op s/p exploratory laparotomy and right colectomy secondary to perforated right colon cancer    - Still awaiting return of bowel function, continue NPO  - AKI; NS increased to 125 ml/hr - Continue NGT decompression; LIS; monitor and record output - Continue IV Abx (Zosyn) - Monitor abdominal examination; on-going bowel function   - Pain control prn; antiemetics prn - Mobilize with therapies    All of  the above findings and recommendations were discussed with the patient, and the medical team, and all of patient's questions were answered to his expressed satisfaction.  -- [name], PA-C [facility] 03/20/2023, 8:01 AM M-F: 7am - 4pm

## 2023-03-21 MED ORDER — DIPHENHYDRAMINE HCL 25 MG PO CAPS
50.0000 mg | ORAL_CAPSULE | Freq: Three times a day (TID) | ORAL | Status: DC | PRN
  Filled 2023-03-21: qty 2

## 2023-03-21 MED ORDER — DIPHENHYDRAMINE HCL 50 MG/ML IJ SOLN: 25.0000 mg | Freq: Four times a day (QID) | INTRAMUSCULAR | Status: DC | PRN

## 2023-03-21 NOTE — Progress Notes (Addendum)
[facility] SURGICAL PROGRESS NOTE  Hospital Day(s): 3.   Post op day(s): 3 Days Post-Op.   Interval History:  Patient seen and examined No acute events or new complaints overnight.  Patient reports still with abdominal soreness but improving No fever, chills, nausea, emesis Renal function normalized; sCr - 0.91; UO - 800 ccs NGT output 400 ccs; less bilious today Surgical drain with 80 ccs in last 24 hours; serosanguinous He is NPO No flatus Working with therapies   Vital signs in last 24 hours: [min-max] current  Temp:  [98.6 F (37 C)-99 F (37.2 C)] 98.8 F (37.1 C) (07/30 0448) Pulse Rate:  [74-83] 78 (07/30 0448) Resp:  [18-20] 18 (07/30 0448) BP: (157-174)/(90-93) 174/93 (07/30 0448) SpO2:  [92 %-96 %] 95 % (07/30 0448)     Height: 5\' 10"  (177.8 cm) Weight: 81.6 kg BMI (Calculated): 25.83   Intake/Output last 2 shifts:  07/29 0701 - 07/30 0700 In: 506.1 [I.V.:344.7; IV Piggyback:161.5] Out: 880 [Urine:800; Drains:80]   Physical Exam:  Constitutional: alert, cooperative and no distress  HEENT: NGT in place; output less bilious Respiratory: breathing non-labored at rest  Cardiovascular: regular rate and sinus rhythm  Gastrointestinal: Soft, incisional soreness, non-distended, no rebound/guarding. Surgical drain in LLQ; output serosanguinous  Integumentary: Laparotomy is intact with staples, penrose in place, there is serosanguinous drainage to dependent portion.   Labs:     Latest Ref Rng & Units 03/20/2023    4:50 AM 03/19/2023    4:25 AM 03/18/2023    4:26 PM  CBC  WBC 4.0 - 10.5 K/uL 8.9  9.7  9.9   Hemoglobin 13.0 - 17.0 g/dL 16.1  09.6  04.5   Hematocrit 39.0 - 52.0 % 33.9  35.5  41.1   Platelets 150 - 400 K/uL 190  206  248       Latest Ref Rng & Units 03/20/2023    4:50 AM 03/19/2023    4:25 AM 03/18/2023    4:26 PM  CMP  Glucose 70 - 99 mg/dL 409  811  914   BUN 8 - 23 mg/dL 26  22  25    Creatinine 0.61 - 1.24 mg/dL 7.82  9.56   2.13   Sodium 135 - 145 mmol/L 140  139  138   Potassium 3.5 - 5.1 mmol/L 4.5  3.7  3.3   Chloride 98 - 111 mmol/L 108  109  107   CO2 22 - 32 mmol/L 24  21  23    Calcium 8.9 - 10.3 mg/dL 8.8  8.6  9.3   Total Protein 6.5 - 8.1 g/dL   6.2   Total Bilirubin 0.3 - 1.2 mg/dL   0.5   Alkaline Phos 38 - 126 U/L   58   AST 15 - 41 U/L   17   ALT 0 - 44 U/L   15      Imaging studies: No new pertinent imaging studies   Assessment/Plan:  89 y.o. male with resolved AKI, otherwise awaiting ROBF, 3 Days Post-Op s/p exploratory laparotomy and right colectomy secondary to perforated right colon cancer    - Still awaiting return of bowel function, continue NPO. If we fail to have return of bowel function in next 48 hours, will likely need TPN. Not surprising if he develops ileus given perforation and extensive surgery - AKI resolved; will wean NS to 100 ml/hr - Continue NGT decompression; LIS; monitor and record output - Continue IV Abx (Zosyn) - Monitor abdominal examination;  on-going bowel function   - Pain control prn; antiemetics prn - Mobilize with therapies; doing well considering  - Follow up pathology when available   All of the above findings and recommendations were discussed with the patient, and the medical team, and all of patient's questions were answered to his expressed satisfaction.  -- [name], PA-C [facility] 03/21/2023, 7:04 AM M-F: 7am - 4pm

## 2023-03-21 NOTE — Plan of Care (Signed)
  Problem: Education: Goal: Knowledge of General Education information will improve Description: Including pain rating scale, medication(s)/side effects and non-pharmacologic comfort measures 03/21/2023 1406 by Wendee Beavers, RN Outcome: Progressing 03/21/2023 1406 by Wendee Beavers, RN Outcome: Progressing   Problem: Activity: Goal: Risk for activity intolerance will decrease 03/21/2023 1406 by Wendee Beavers, RN Outcome: Progressing 03/21/2023 1406 by Wendee Beavers, RN Outcome: Progressing   Problem: Coping: Goal: Level of anxiety will decrease 03/21/2023 1406 by Wendee Beavers, RN Outcome: Progressing 03/21/2023 1406 by Wendee Beavers, RN Outcome: Progressing   Problem: Elimination: Goal: Will not experience complications related to bowel motility 03/21/2023 1406 by Wendee Beavers, RN Outcome: Progressing 03/21/2023 1406 by Wendee Beavers, RN Outcome: Progressing Goal: Will not experience complications related to urinary retention 03/21/2023 1406 by Wendee Beavers, RN Outcome: Progressing 03/21/2023 1406 by Wendee Beavers, RN Outcome: Progressing   Problem: Pain Managment: Goal: General experience of comfort will improve 03/21/2023 1406 by Wendee Beavers, RN Outcome: Progressing 03/21/2023 1406 by Wendee Beavers, RN Outcome: Progressing   Problem: Safety: Goal: Ability to remain free from injury will improve 03/21/2023 1406 by Wendee Beavers, RN Outcome: Progressing 03/21/2023 1406 by Wendee Beavers, RN Outcome: Progressing

## 2023-03-21 NOTE — TOC Progression Note (Signed)
Transition of Care [facility]) - Progression Note    Patient Details  Name: Don Arias MRN: [id number] Date of Birth: 11/04/1929  Transition of Care [facility]) CM/SW Contact  Hetty Ely, RN Phone Number: 03/21/2023, 3:19 PM  Clinical Narrative: CM spoke with daughter, Tressia Miners who reports that patient still has NG tube and is now agreeing to [facility] for discharge. Preference [facility] [employer]. CM to start the process.    Expected Discharge Plan: Skilled Nursing Facility Barriers to Discharge: Continued Medical Work up  Expected Discharge Plan and Services     Post Acute Care Choice:  (TBD) Living arrangements for the past 2 months: Single Family Home                                       Social Determinants of Health (SDOH) Interventions SDOH Screenings   Food Insecurity: No Food Insecurity (03/19/2023)  Housing: Low Risk  (03/19/2023)  Transportation Needs: No Transportation Needs (03/19/2023)  Utilities: Not At Risk (03/19/2023)  Tobacco Use: Medium Risk (03/18/2023)    Readmission Risk Interventions     No data to display

## 2023-03-21 NOTE — Progress Notes (Signed)
Occupational Therapy Treatment Patient Details Name: Don Arias MRN: [id number] DOB: 1930/06/10 Today's Date: 03/21/2023   History of present illness Pt is a 89 yo M status post exploratory laparotomy and right hemicolectomy for right colon perforation likely from colonic obstruction.  PMH includes skin CA, HTN, and anxiety.   OT comments  Pt seen for OT tx with PT co-tx to optimize safety with ADL mobility. Pt endorsing 7/10 abdominal pain during session, agreeable, and coordinated with nursing for optimal pain control prior to session start. RN disconnected NG tube suction at start of session, notified at end of session to reconnect. Pt required MOD A +2 for log rolling to come sit EOB. Once EOB able to scoot with increased time/effort to complete. MIN A +2 for initial STS from EOB with VC for hand placement and pt tolerated taking several steps to the side, forward, and backward with CGA+2 with RW. STS x2 from recliner with VC for hand placement and requiring CGA+2 to complete, marching in place for ~16min without LOB noted, BUE support on RW. Pt educated in recliner controls to improve self mgt of comfort/pain while up. Pt demonstrating improvement in ADL mobility this date. Will continue to progress as able optimizing pain control.    Recommendations for follow up therapy are one component of a multi-disciplinary discharge planning process, led by the attending physician.  Recommendations may be updated based on patient status, additional functional criteria and insurance authorization.    Assistance Recommended at Discharge Frequent or constant Supervision/Assistance  Patient can return home with the following  A lot of help with walking and/or transfers;A lot of help with bathing/dressing/bathroom;Assist for transportation;Assistance with cooking/housework;Help with stairs or ramp for entrance   Equipment Recommendations  Other (comment) (2ww)    Recommendations for Other Services       Precautions / Restrictions Precautions Precautions: Fall Precaution Comments: NGT, abdominal JP drain Restrictions Weight Bearing Restrictions: No       Mobility Bed Mobility Overal bed mobility: Needs Assistance Bed Mobility: Rolling, Sidelying to Sit Rolling: Mod assist, +2 for physical assistance Sidelying to sit: Mod assist, +2 for physical assistance       General bed mobility comments: cueing for log roll technique for comfort, mod A x2 to achieve an upright sitting position at EOB    Transfers Overall transfer level: Needs assistance Equipment used: Rolling walker (2 wheels) Transfers: Sit to/from Stand Sit to Stand: Min assist, +2 physical assistance, Min guard           General transfer comment: increased time and effort, cueing for safe hand placement and technique; pt performing sit<>stand x1 from EOB with min A +2 and x2 from recliner chair with min guard +2     Balance Overall balance assessment: Needs assistance Sitting-balance support: Bilateral upper extremity supported, Single extremity supported, Feet supported Sitting balance-Leahy Scale: Fair     Standing balance support: Reliant on assistive device for balance Standing balance-Leahy Scale: Poor                             ADL either performed or assessed with clinical judgement   ADL Overall ADL's : Needs assistance/impaired                                     Functional mobility during ADLs: Min guard;Minimal assistance;+2 for physical assistance;Rolling walker (  2 wheels)      Extremity/Trunk Assessment              Vision       Perception     Praxis      Cognition Arousal/Alertness: Awake/alert Behavior During Therapy: Anxious Overall Cognitive Status: Within Functional Limits for tasks assessed                                          Exercises Other Exercises Other Exercises: Additional instruction in ADL mobility to  minimize abdominal pain and minimize falls risk. Marched in place x25min wiht CGA+RW    Shoulder Instructions       General Comments RN disconnected NG tube from suction at start of session, notified at end of session to reconnect    Pertinent Vitals/ Pain       Pain Assessment Pain Assessment: 0-10 Pain Score: 7  Pain Location: Abdomen Pain Descriptors / Indicators: Sore, Guarding Pain Intervention(s): Limited activity within patient's tolerance, Monitored during session, Premedicated before session, Repositioned  Home Living                                          Prior Functioning/Environment              Frequency  Min 1X/week        Progress Toward Goals  OT Goals(current goals can now be found in the care plan section)  Progress towards OT goals: Progressing toward goals  Acute Rehab OT Goals Patient Stated Goal: go home OT Goal Formulation: With patient/family Time For Goal Achievement: 04/03/23 Potential to Achieve Goals: Good  Plan Discharge plan remains appropriate;Frequency remains appropriate    Co-evaluation    PT/OT/SLP Co-Evaluation/Treatment: Yes Reason for Co-Treatment: To address functional/ADL transfers;Complexity of the patient's impairments (multi-system involvement) PT goals addressed during session: Mobility/safety with mobility OT goals addressed during session: ADL's and self-care      AM-PAC OT "6 Clicks" Daily Activity     Outcome Measure   Help from another person eating meals?: None Help from another person taking care of personal grooming?: A Little Help from another person toileting, which includes using toliet, bedpan, or urinal?: A Lot Help from another person bathing (including washing, rinsing, drying)?: A Lot Help from another person to put on and taking off regular upper body clothing?: A Little Help from another person to put on and taking off regular lower body clothing?: A Lot 6 Click Score:  16    End of Session Equipment Utilized During Treatment: Rolling walker (2 wheels)  OT Visit Diagnosis: Other abnormalities of gait and mobility (R26.89);Muscle weakness (generalized) (M62.81);Pain Pain - Right/Left:  (abdomen)   Activity Tolerance Patient tolerated treatment well   Patient Left in chair;with call bell/phone within reach;with chair alarm set;with family/visitor present   Nurse Communication Other (comment);Mobility status (NG tube ready to be reconnected, pt going to try and sit up in recliner for 1hr)        Time: 1345-1417 OT Time Calculation (min): 32 min  Charges: OT General Charges $OT Visit: 1 Visit OT Treatments $Self Care/Home Management : 8-22 mins  Arman Filter., MPH, MS, OTR/L ascom [phone] 03/21/23, 3:40 PM

## 2023-03-21 NOTE — Care Management Important Message (Signed)
Important Message  Patient Details  Name: NED FIORETTI MRN: [id number] Date of Birth: 01/02/1930   Medicare Important Message Given:  N/A - LOS <3 / Initial given by admissions     Olegario Messier A  03/21/2023, 10:20 AM

## 2023-03-21 NOTE — Progress Notes (Signed)
Physical Therapy Treatment Patient Details Name: Don Arias MRN: [id number] DOB: Jul 11, 1930 Today's Date: 03/21/2023   History of Present Illness Pt is a 89 yo M status post exploratory laparotomy and right hemicolectomy for right colon perforation likely from colonic obstruction.  PMH includes skin CA, HTN, and anxiety.    PT Comments  Today's session was a co-tx with OT and was coordinated with pt's RN for pain meds/management. Pt making slow progress with functional mobility. He remains limited secondary to pain and generalized weakness. Pt was able to tolerate taking side steps from the bed to the recliner chair and agreeable to stay sitting up in chair for 1 hour. Daughter present in room and agreeable to plan as well. Pt would continue to benefit from skilled physical therapy services at this time while admitted and after d/c to address the below listed limitations in order to improve overall safety and independence with functional mobility.    If plan is discharge home, recommend the following: A lot of help with walking and/or transfers;A lot of help with bathing/dressing/bathroom;Assistance with cooking/housework;Direct supervision/assist for medications management;Help with stairs or ramp for entrance;Assist for transportation   Can travel by private vehicle     No  Equipment Recommendations  Rolling walker (2 wheels)    Recommendations for Other Services       Precautions / Restrictions Precautions Precautions: Fall Precaution Comments: NGT, abdominal JP drain Restrictions Weight Bearing Restrictions: No     Mobility  Bed Mobility Overal bed mobility: Needs Assistance Bed Mobility: Rolling, Sidelying to Sit Rolling: Mod assist, +2 for physical assistance Sidelying to sit: Mod assist, +2 for physical assistance       General bed mobility comments: cueing for log roll technique for comfort, mod A x2 to achieve an upright sitting position at EOB     Transfers Overall transfer level: Needs assistance Equipment used: Rolling walker (2 wheels) Transfers: Sit to/from Stand Sit to Stand: Min assist, +2 physical assistance, Min guard           General transfer comment: increased time and effort, cueing for safe hand placement and technique; pt performing sit<>stand x1 from EOB with min A x2 and x2 from recliner chair with min guard    Ambulation/Gait             Pre-gait activities: pt able to take 5-6 very slow, cautious and guarded side steps towards his left side from EOB to recliner chair with min guard x2 and RW     Stairs             Wheelchair Mobility     Tilt Bed    Modified Rankin (Stroke Patients Only)       Balance Overall balance assessment: Needs assistance Sitting-balance support: Bilateral upper extremity supported, Single extremity supported, Feet supported Sitting balance-Leahy Scale: Fair     Standing balance support: Reliant on assistive device for balance Standing balance-Leahy Scale: Poor                              Cognition Arousal/Alertness: Awake/alert Behavior During Therapy: Anxious Overall Cognitive Status: Within Functional Limits for tasks assessed                                          Exercises General Exercises - Lower Extremity Hip Flexion/Marching: Standing,  10 reps, Both, Strengthening    General Comments        Pertinent Vitals/Pain Pain Assessment Pain Assessment: 0-10 Pain Score: 7  Pain Location: Abdomen Pain Descriptors / Indicators: Sore, Guarding Pain Intervention(s): Monitored during session, Repositioned, Premedicated before session    Home Living                          Prior Function            PT Goals (current goals can now be found in the care plan section) Acute Rehab PT Goals Patient Stated Goal: pain control PT Goal Formulation: With patient Time For Goal Achievement:  04/01/23 Potential to Achieve Goals: Good Progress towards PT goals: Progressing toward goals    Frequency    Min 1X/week      PT Plan Current plan remains appropriate    Co-evaluation PT/OT/SLP Co-Evaluation/Treatment: Yes Reason for Co-Treatment: To address functional/ADL transfers;Complexity of the patient's impairments (multi-system involvement) PT goals addressed during session: Mobility/safety with mobility        AM-PAC PT "6 Clicks" Mobility   Outcome Measure  Help needed turning from your back to your side while in a flat bed without using bedrails?: A Lot Help needed moving from lying on your back to sitting on the side of a flat bed without using bedrails?: A Lot Help needed moving to and from a bed to a chair (including a wheelchair)?: A Lot Help needed standing up from a chair using your arms (e.g., wheelchair or bedside chair)?: A Lot Help needed to walk in hospital room?: Total Help needed climbing 3-5 steps with a railing? : Total 6 Click Score: 10    End of Session   Activity Tolerance: Patient tolerated treatment well Patient left: in chair;with call bell/phone within reach;with chair alarm set;with family/visitor present Nurse Communication: Mobility status;Other (comment) (pt in chair and ready for NGT reconnection to suction) PT Visit Diagnosis: Difficulty in walking, not elsewhere classified (R26.2);Muscle weakness (generalized) (M62.81);Pain Pain - part of body:  (abdomen)     Time: 3557-3220 PT Time Calculation (min) (ACUTE ONLY): 32 min  Charges:    $Therapeutic Activity: 8-22 mins PT General Charges $$ ACUTE PT VISIT: 1 Visit                     Arletta Bale, DPT  Acute Rehabilitation Services Office [phone]    Alessandra Bevels  03/21/2023, 2:30 PM

## 2023-03-21 NOTE — Consult Note (Signed)
Pharmacy Antibiotic Note  Don Arias is a 89 y.o. male admitted on 03/18/2023 with  intra-abdominal infection /bowel perforation. Pharmacy has been consulted for Zosyn dosing.  Plan: Day 4 of antibiotics continue Zosyn 3.375 g IV Q8H Continue to monitor renal function and follow culture results   Height: 5\' 10"  (177.8 cm) Weight: 81.6 kg (180 lb) IBW/kg (Calculated) : 73  Temp (24hrs), Avg:98.7 F (37.1 C), Min:98.2 F (36.8 C), Max:99 F (37.2 C)  Recent Labs  Lab 03/18/23 1626 03/19/23 0425 03/20/23 0450 03/21/23 0632  WBC 9.9 9.7 8.9  --   CREATININE 1.06 0.96 1.35* 0.91    Estimated Creatinine Clearance: 55.7 mL/min (by C-G formula based on SCr of 0.91 mg/dL).    Allergies  Allergen Reactions   Nitroglycerin Other (See Comments)    Cardiac Arrest   Gabapentin     Other Reaction(s): Coordination problem   Hydralazine Hcl     Other Reaction(s): Unknown   Lisinopril Cough    Other reaction(s): Cough   Pregabalin     Other Reaction(s): Dizziness  Other reaction(s): Dizziness   Tramadol     Other Reaction(s): Other (See Comments), Unknown   Valsartan     Other Reaction(s): Cough  Other reaction(s): Cough   Verapamil     Other Reaction(s): Dizziness  Other reaction(s): Dizziness   Guaifenesin Rash   Sulfa Antibiotics Rash    Antimicrobials this admission: 7/27 Zosyn >>   Dose adjustments this admission: N/A  Microbiology results: N/A  Thank you for allowing pharmacy to be a part of this patient's care.  Angelique Blonder, PharmD Clinical Pharmacist 03/21/2023 10:37 AM

## 2023-03-21 NOTE — NC FL2 (Signed)
[place] MEDICAID FL2 LEVEL OF CARE FORM     IDENTIFICATION  Patient Name: Don Arias Birthdate: Jun 03, 1930 Sex: male Admission Date (Current Location): 03/18/2023  [facility] and [place] Number:  Chiropodist and Address:  [facility], [address] [place], [place] [address]      Provider Number: [id number]  Attending Physician Name and Address:  Henrene Dodge, MD  Relative Name and Phone Number:  Molinda Bailiff [phone]    Current Level of Care: Hospital Recommended Level of Care: Skilled Nursing Facility Prior Approval Number:    Date Approved/Denied:   PASRR Number: [id number]  Discharge Plan: SNF    Current Diagnoses: Patient Active Problem List   Diagnosis Date Noted   Colon perforation (HCC) 03/19/2023   Pneumoperitoneum 03/18/2023   GERD (gastroesophageal reflux disease) 04/16/2021   Panic disorder 04/16/2021   Grief 04/16/2021    Orientation RESPIRATION BLADDER Height & Weight     Self, Time, Situation  Normal Continent Weight: 81.6 kg Height:  5\' 10"  (177.8 cm)  BEHAVIORAL SYMPTOMS/MOOD NEUROLOGICAL BOWEL NUTRITION STATUS           AMBULATORY STATUS COMMUNICATION OF NEEDS Skin   Extensive Assist Verbally Normal                       Personal Care Assistance Level of Assistance  Bathing, Feeding, Dressing Bathing Assistance: Limited assistance Feeding assistance: Independent Dressing Assistance: Limited assistance     Functional Limitations Info  Sight, Hearing, Speech Sight Info: Adequate Hearing Info: Adequate Speech Info: Adequate    SPECIAL CARE FACTORS FREQUENCY  PT (By licensed PT), OT (By licensed OT)     PT Frequency: 5x week OT Frequency: 5x week            Contractures Contractures Info: Not present    Additional Factors Info  Code Status Code Status Info: DNR             Current Medications (03/21/2023):  This is the current hospital active medication list Current  Facility-Administered Medications  Medication Dose Route Frequency Provider Last Rate Last Admin   0.9 %  sodium chloride infusion   Intravenous PRN Henrene Dodge, MD   Stopped at 03/20/23 0523   0.9 %  sodium chloride infusion   Intravenous Continuous Donovan Kail, PA-C 100 mL/hr at 03/21/23 1333 New Bag at 03/21/23 1333   enoxaparin (LOVENOX) injection 40 mg  40 mg Subcutaneous Q24H Piscoya, Jose, MD   40 mg at 03/20/23 2127   HYDROmorphone (DILAUDID) injection 0.5 mg  0.5 mg Intravenous Q2H PRN Piscoya, Elita Quick, MD   0.5 mg at 03/21/23 1330   ondansetron (ZOFRAN-ODT) disintegrating tablet 4 mg  4 mg Oral Q6H PRN Piscoya, Jose, MD       Or   ondansetron (ZOFRAN) injection 4 mg  4 mg Intravenous Q6H PRN Piscoya, Jose, MD       Oral care mouth rinse  15 mL Mouth Rinse PRN Piscoya, Jose, MD       pantoprazole (PROTONIX) injection 40 mg  40 mg Intravenous QHS Piscoya, Jose, MD   40 mg at 03/20/23 2127   phenol (CHLORASEPTIC) mouth spray 1 spray  1 spray Mouth/Throat PRN Piscoya, Jose, MD       piperacillin-tazobactam (ZOSYN) IVPB 3.375 g  3.375 g Intravenous Q8H Piscoya, Jose, MD 12.5 mL/hr at 03/21/23 1331 3.375 g at 03/21/23 1331     Discharge Medications: Please see discharge summary for  a list of discharge medications.  Relevant Imaging Results:  Relevant Lab Results:   Additional Information    Hetty Ely, RN

## 2023-03-22 ENCOUNTER — Other Ambulatory Visit: Payer: Self-pay

## 2023-03-22 LAB — TYPE AND SCREEN
ABO/RH(D): O POS
Antibody Screen: POSITIVE
Unit division: 0
Unit division: 0

## 2023-03-22 LAB — PHOSPHORUS
Phosphorus: 1.9 mg/dL — ABNORMAL LOW (ref 2.5–4.6)
Phosphorus: 1.7 mg/dL — ABNORMAL LOW (ref 2.5–4.6)

## 2023-03-22 LAB — MAGNESIUM: Magnesium: 2.4 mg/dL (ref 1.7–2.4)

## 2023-03-22 LAB — POTASSIUM: Potassium: 3.4 mmol/L — ABNORMAL LOW (ref 3.5–5.1)

## 2023-03-22 MED ORDER — KETOROLAC TROMETHAMINE 30 MG/ML IJ SOLN
15.0000 mg | Freq: Four times a day (QID) | INTRAMUSCULAR | Status: AC | PRN
  Administered 2023-03-22 – 2023-03-27 (×6): 15 mg via INTRAVENOUS
  Filled 2023-03-22 (×7): qty 1

## 2023-03-22 MED ORDER — THIAMINE HCL 100 MG/ML IJ SOLN
100.0000 mg | Freq: Once | INTRAMUSCULAR | Status: AC
  Administered 2023-03-22: 100 mg via INTRAVENOUS
  Filled 2023-03-22: qty 2

## 2023-03-22 MED ORDER — SODIUM CHLORIDE 0.9% FLUSH
10.0000 mL | Freq: Two times a day (BID) | INTRAVENOUS | Status: DC
  Administered 2023-03-22: 10 mL
  Administered 2023-03-22: 20 mL
  Administered 2023-03-23 – 2023-03-26 (×6): 10 mL
  Administered 2023-03-27: 20 mL
  Administered 2023-03-27 – 2023-04-04 (×14): 10 mL

## 2023-03-22 MED ORDER — INSULIN ASPART 100 UNIT/ML IJ SOLN
0.0000 [IU] | Freq: Four times a day (QID) | INTRAMUSCULAR | Status: DC
  Administered 2023-03-23: 2 [IU] via SUBCUTANEOUS
  Administered 2023-03-23: 1 [IU] via SUBCUTANEOUS
  Administered 2023-03-23: 3 [IU] via SUBCUTANEOUS
  Administered 2023-03-24 (×2): 2 [IU] via SUBCUTANEOUS
  Administered 2023-03-24: 1 [IU] via SUBCUTANEOUS
  Administered 2023-03-24 – 2023-03-26 (×10): 2 [IU] via SUBCUTANEOUS
  Administered 2023-03-27: 1 [IU] via SUBCUTANEOUS
  Administered 2023-03-27: 2 [IU] via SUBCUTANEOUS
  Administered 2023-03-27: 1 [IU] via SUBCUTANEOUS
  Administered 2023-03-28 (×2): 2 [IU] via SUBCUTANEOUS
  Administered 2023-03-28 – 2023-03-29 (×4): 1 [IU] via SUBCUTANEOUS
  Administered 2023-03-29: 2 [IU] via SUBCUTANEOUS
  Administered 2023-03-30: 1 [IU] via SUBCUTANEOUS
  Administered 2023-03-30 (×2): 2 [IU] via SUBCUTANEOUS
  Administered 2023-03-31: 1 [IU] via SUBCUTANEOUS
  Administered 2023-03-31: 2 [IU] via SUBCUTANEOUS
  Administered 2023-03-31: 1 [IU] via SUBCUTANEOUS
  Administered 2023-03-31 – 2023-04-01 (×3): 2 [IU] via SUBCUTANEOUS
  Administered 2023-04-02: 1 [IU] via SUBCUTANEOUS
  Administered 2023-04-02: 2 [IU] via SUBCUTANEOUS
  Administered 2023-04-02 – 2023-04-03 (×4): 1 [IU] via SUBCUTANEOUS
  Filled 2023-03-22 (×41): qty 1

## 2023-03-22 MED ORDER — POTASSIUM PHOSPHATES 15 MMOLE/5ML IV SOLN
30.0000 mmol | Freq: Once | INTRAVENOUS | Status: AC
  Administered 2023-03-22: 30 mmol via INTRAVENOUS
  Filled 2023-03-22: qty 10

## 2023-03-22 MED ORDER — ACETAMINOPHEN 10 MG/ML IV SOLN
1000.0000 mg | Freq: Four times a day (QID) | INTRAVENOUS | Status: AC
  Administered 2023-03-22 (×3): 1000 mg via INTRAVENOUS
  Filled 2023-03-22 (×4): qty 100

## 2023-03-22 MED ORDER — TRAVASOL 10 % IV SOLN
INTRAVENOUS | Status: AC
  Filled 2023-03-22: qty 480

## 2023-03-22 MED ORDER — SODIUM CHLORIDE 0.9 % IV SOLN: INTRAVENOUS | Status: AC

## 2023-03-22 MED ORDER — POTASSIUM PHOSPHATES 15 MMOLE/5ML IV SOLN
20.0000 mmol | Freq: Once | INTRAVENOUS | Status: AC
  Administered 2023-03-22: 20 mmol via INTRAVENOUS
  Filled 2023-03-22: qty 6.67

## 2023-03-22 MED ORDER — CHLORHEXIDINE GLUCONATE CLOTH 2 % EX PADS
6.0000 | MEDICATED_PAD | Freq: Every day | CUTANEOUS | Status: DC
  Administered 2023-03-22 – 2023-04-04 (×14): 6 via TOPICAL

## 2023-03-22 MED ORDER — SODIUM CHLORIDE 0.9% FLUSH: 10.0000 mL | INTRAVENOUS | Status: DC | PRN

## 2023-03-22 MED ORDER — THIAMINE HCL 100 MG/ML IJ SOLN
100.0000 mg | INTRAMUSCULAR | Status: AC
  Administered 2023-03-23 – 2023-03-26 (×4): 100 mg via INTRAVENOUS
  Filled 2023-03-22 (×4): qty 2

## 2023-03-22 NOTE — Progress Notes (Signed)
Initial Nutrition Assessment  DOCUMENTATION CODES:   Not applicable  INTERVENTION:   TPN per pharmacy   Recommend thiamine 100mg  IV daily x 5 days   Pt at high refeed risk; recommend monitor potassium, magnesium and phosphorus labs daily until stable  Daily weights   NUTRITION DIAGNOSIS:   Inadequate oral intake related to altered GI function as evidenced by NPO status.  GOAL:   Patient will meet greater than or equal to 90% of their needs  MONITOR:   Diet advancement, Labs, Weight trends, Other (Comment), I & O's, Skin (TPN)  REASON FOR ASSESSMENT:   Consult New TPN/TNA  ASSESSMENT:   89 y/o male with h/o GERD, PUD (gastric and duodenal ulcers), HTN, BPH and anxiety who is admitted with right colon obstruction with right colon perforation now s/p exploratory laparotomy with right colectomy with ileocolonic anastomosis 7/28 complicated by post op ileus.  Met with pt and family in room today. Pt sitting in chair in good spirits. Pt's daughter and granddaughter at bedside. Pt reports good appetite and oral intake at baseline. Pt reports that he eats a lot of protein and he exercises to keep his muscles strong. Pt reports that he is starting to feel hungry today. Pt NPO since admission. NGT in place to LIS with ~889ml in canister. Pt denies any flatus or BM yet. Pt reports pain mainly around his incision. Pt reports that he walked to the door and back today. RD discussed with pt the importance of adequate nutrition needed to preserve lean muscle and support post op healing. Plan today is for TPN; PICC line in place. Phosphorus low today and is being repleted prior to initiation of TPN. Pt is at high refeed risk. Pt is agreeable to supplements with diet advancement. There is no weight history in chart to determine if any significant changes pta. Pt denies any weight loss.   Medications reviewed and include: lovenox, insulin, protonix, thiamine, zosyn, Kphos  Labs reviewed: K 3.6  wnl, P 1.9(L), Mg 2.4 wnl Hgb 10.5(L), Hct 33.2(L)  NUTRITION - FOCUSED PHYSICAL EXAM:  Flowsheet Row Most Recent Value  Orbital Region No depletion  Upper Arm Region No depletion  Thoracic and Lumbar Region No depletion  Buccal Region No depletion  Temple Region No depletion  Clavicle Bone Region Mild depletion  Clavicle and Acromion Bone Region Mild depletion  Scapular Bone Region No depletion  Dorsal Hand No depletion  Patellar Region No depletion  Anterior Thigh Region No depletion  Posterior Calf Region No depletion  Edema (RD Assessment) None  Hair Reviewed  Eyes Reviewed  Mouth Reviewed  Skin Reviewed  Nails Reviewed   Diet Order:   Diet Order             Diet NPO time specified  Diet effective now                  EDUCATION NEEDS:   Education needs have been addressed  Skin:  Skin Assessment: Reviewed RN Assessment (incision abdomen)  Last BM:  7/25  Height:   Ht Readings from Last 1 Encounters:  03/18/23 5\' 10"  (1.778 m)    Weight:   Wt Readings from Last 1 Encounters:  03/18/23 81.6 kg    Ideal Body Weight:  75.45 kg  BMI:  Body mass index is 25.83 kg/m.  Estimated Nutritional Needs:   Kcal:  1900-2200kcal/day  Protein:  95-110g/day  Fluid:  1.9-2.2L/day  Betsey Holiday MS, RD, LDN Please refer to [facility] for RD  and/or RD on-call/weekend/after hours pager

## 2023-03-22 NOTE — Progress Notes (Signed)
[facility] SURGICAL PROGRESS NOTE  Hospital Day(s): 4.   Post op day(s): 4 Days Post-Op.   Interval History:  Patient seen and examined No acute events or new complaints overnight.  Patient reports still with abdominal soreness but improving No fever, chills, nausea, emesis Renal function normalized; sCr - 0.91; UO - 925 ccs NGT output 400 ccs Surgical drain with 90 ccs in last 24 hours; serosanguinous He is NPO Still without flatus Working with therapies   Vital signs in last 24 hours: [min-max] current  Temp:  [98.2 F (36.8 C)-99.2 F (37.3 C)] 98.4 F (36.9 C) (07/31 0345) Pulse Rate:  [65-71] 65 (07/31 0345) Resp:  [16-18] 18 (07/31 0345) BP: (152-177)/(72-90) 152/72 (07/31 0345) SpO2:  [95 %-100 %] 95 % (07/31 0345)     Height: 5\' 10"  (177.8 cm) Weight: 81.6 kg BMI (Calculated): 25.83   Intake/Output last 2 shifts:  07/30 0701 - 07/31 0700 In: 2325.5 [I.V.:2178.8; IV Piggyback:146.7] Out: 1415 [Urine:925; Emesis/NG output:400; Drains:90]   Physical Exam:  Constitutional: alert, cooperative and no distress  HEENT: NGT in place; output less bilious Respiratory: breathing non-labored at rest  Cardiovascular: regular rate and sinus rhythm  Gastrointestinal: Soft, incisional soreness, non-distended, no rebound/guarding. Surgical drain in LLQ; output serosanguinous  Integumentary: Laparotomy is intact with staples, penrose in place, there is serosanguinous drainage to dependent portion.   Labs:     Latest Ref Rng & Units 03/22/2023    4:50 AM 03/20/2023    4:50 AM 03/19/2023    4:25 AM  CBC  WBC 4.0 - 10.5 K/uL 10.2  8.9  9.7   Hemoglobin 13.0 - 17.0 g/dL 16.1  09.6  04.5   Hematocrit 39.0 - 52.0 % 33.2  33.9  35.5   Platelets 150 - 400 K/uL 232  190  206       Latest Ref Rng & Units 03/22/2023    4:50 AM 03/21/2023    6:32 AM 03/20/2023    4:50 AM  CMP  Glucose 70 - 99 mg/dL 79  72  409   BUN 8 - 23 mg/dL 18  18  26    Creatinine 0.61 - 1.24  mg/dL 8.11  9.14  7.82   Sodium 135 - 145 mmol/L 141  141  140   Potassium 3.5 - 5.1 mmol/L 3.6  3.6  4.5   Chloride 98 - 111 mmol/L 112  110  108   CO2 22 - 32 mmol/L 21  21  24    Calcium 8.9 - 10.3 mg/dL 8.0  8.2  8.8      Imaging studies: No new pertinent imaging studies   Assessment/Plan:  89 y.o. male with resolved AKI, otherwise awaiting ROBF, 4 Days Post-Op s/p exploratory laparotomy and right colectomy secondary to perforated right colon cancer    - Will consult for PICC and TPN given lack of bowel function and continued NPO status  - AKI resolved; NS to 100 ml/hr - Continue NGT decompression; LIS; monitor and record output - Continue IV Abx (Zosyn) - Monitor abdominal examination; on-going bowel function   - Pain control prn; antiemetics prn - Mobilize with therapies; doing well considering  - Follow up pathology when available   All of the above findings and recommendations were discussed with the patient, and the medical team, and all of patient's questions were answered to his expressed satisfaction.  -- [name], PA-C [facility] 03/22/2023, 7:34 AM M-F: 7am - 4pm

## 2023-03-22 NOTE — Progress Notes (Signed)
Peripherally Inserted Central Catheter Placement  The IV Nurse has discussed with the patient and/or persons authorized to consent for the patient, the purpose of this procedure and the potential benefits and risks involved with this procedure.  The benefits include less needle sticks, lab draws from the catheter, and the patient may be discharged home with the catheter. Risks include, but not limited to, infection, bleeding, blood clot (thrombus formation), and puncture of an artery; nerve damage and irregular heartbeat and possibility to perform a PICC exchange if needed/ordered by physician.  Alternatives to this procedure were also discussed.  Bard Power PICC patient education guide, fact sheet on infection prevention and patient information card has been provided to patient /or left at bedside.    PICC Placement Documentation  PICC Double Lumen 03/22/23 Right Basilic 39 cm 0 cm (Active)  Indication for Insertion or Continuance of Line Administration of hyperosmolar/irritating solutions (i.e. TPN, Vancomycin, etc.) 03/22/23 1000  Exposed Catheter (cm) 0 cm 03/22/23 1000  Site Assessment Clean, Dry, Intact 03/22/23 1000  Lumen #1 Status Flushed;Saline locked;Blood return noted 03/22/23 1000  Lumen #2 Status Flushed;Saline locked;Blood return noted 03/22/23 1000  Dressing Type Transparent;Securing device 03/22/23 1000  Dressing Status Antimicrobial disc in place;Clean, Dry, Intact 03/22/23 1000  Safety Lock Not Applicable 03/22/23 1000  Line Care Connections checked and tightened 03/22/23 1000  Line Adjustment (NICU/IV Team Only) No 03/22/23 1000  Dressing Intervention New dressing;Other (Comment) 03/22/23 1000  Dressing Change Due 03/29/23 03/22/23 1000       Annett Fabian 03/22/2023, 10:19 AM

## 2023-03-22 NOTE — Progress Notes (Addendum)
Occupational Therapy Treatment Patient Details Name: Don Arias MRN: [id number] DOB: 03-Oct-1929 Today's Date: 03/22/2023   History of present illness Pt is a 89 yo M status post exploratory laparotomy and right hemicolectomy for right colon perforation likely from colonic obstruction.  PMH includes skin CA, HTN, and anxiety.   OT comments  Chart reviewed to date, co tx completed with PT on this date in order to continue to progress functional mobility. Pt is agreement for tx session targeted above stated goal, pre medicated. Improvements noted in overall amb with pt amb in room with RW with MIN A. MAX A required for toileting with +2 CGA required for balance at edge of bed. Pt continues to perform ADL below PLOF, would benefit from ongoing skilled OT to address functional deficits. OT will continue to follow acutely.   All lines/leads, jp, incision intact pre/post session    Recommendations for follow up therapy are one component of a multi-disciplinary discharge planning process, led by the attending physician.  Recommendations may be updated based on patient status, additional functional criteria and insurance authorization.    Assistance Recommended at Discharge Frequent or constant Supervision/Assistance  Patient can return home with the following  A lot of help with walking and/or transfers;A lot of help with bathing/dressing/bathroom;Assist for transportation;Assistance with cooking/housework;Help with stairs or ramp for entrance   Equipment Recommendations  Other (comment) (2WW)    Recommendations for Other Services      Precautions / Restrictions Precautions Precautions: Fall Precaution Comments: NG, abdominal JP drain Restrictions Weight Bearing Restrictions: No Other Position/Activity Restrictions: Abdominal surgery with LLQ JP drain and NG tube       Mobility Bed Mobility Overal bed mobility: Needs Assistance Bed Mobility: Sidelying to Sit, Supine to Sit,  Rolling Rolling: Min assist Sidelying to sit: Mod assist Supine to sit: Mod assist     General bed mobility comments: step by step vcs for body mechanics    Transfers Overall transfer level: Needs assistance Equipment used: Rolling walker (2 wheels) Transfers: Sit to/from Stand Sit to Stand: Min assist, From elevated surface, +2 safety/equipment                 Balance Overall balance assessment: Needs assistance Sitting-balance support: Feet supported Sitting balance-Leahy Scale: Fair     Standing balance support: Bilateral upper extremity supported, During functional activity, Reliant on assistive device for balance Standing balance-Leahy Scale: Fair                             ADL either performed or assessed with clinical judgement   ADL Overall ADL's : Needs assistance/impaired     Grooming: Wash/dry hands;Sitting;Supervision/safety           Upper Body Dressing : Minimal assistance;Sitting       Toilet Transfer: Min guard;Rolling walker (2 wheels) Toilet Transfer Details (indicate cue type and reason): simulated to bedside chair Toileting- Clothing Manipulation and Hygiene: Maximal assistance;+2 for safety/equipment (to hold urinal in standing)       Functional mobility during ADLs: Min guard;+2 for safety/equipment;Rolling walker (2 wheels) (approx 20' in room)        Cognition Arousal/Alertness: Awake/alert Behavior During Therapy: Anxious Overall Cognitive Status: Within Functional Limits for tasks assessed  General Comments NG tube to suction  disconnected pre session, nurse aware of session end    Pertinent Vitals/ Pain       Pain Assessment Pain Assessment: 0-10 Pain Score: 9  Pain Location: Abdomen Pain Descriptors / Indicators: Guarding, Sore Pain Intervention(s): Monitored during session, Limited activity within patient's tolerance, Premedicated before  session         Frequency  Min 1X/week        Progress Toward Goals  OT Goals(current goals can now be found in the care plan section)  Progress towards OT goals: Progressing toward goals     Plan Discharge plan remains appropriate;Frequency remains appropriate    Co-evaluation    PT/OT/SLP Co-Evaluation/Treatment: Yes Reason for Co-Treatment: Necessary to address cognition/behavior during functional activity;For patient/therapist safety;To address functional/ADL transfers PT goals addressed during session: Mobility/safety with mobility;Balance;Proper use of DME OT goals addressed during session: ADL's and self-care      AM-PAC OT "6 Clicks" Daily Activity     Outcome Measure   Help from another person eating meals?: None Help from another person taking care of personal grooming?: A Little Help from another person toileting, which includes using toliet, bedpan, or urinal?: A Lot Help from another person bathing (including washing, rinsing, drying)?: A Lot Help from another person to put on and taking off regular upper body clothing?: A Little Help from another person to put on and taking off regular lower body clothing?: A Lot 6 Click Score: 16    End of Session Equipment Utilized During Treatment: Rolling walker (2 wheels)  OT Visit Diagnosis: Other abnormalities of gait and mobility (R26.89);Muscle weakness (generalized) (M62.81);Pain   Activity Tolerance Patient tolerated treatment well   Patient Left in chair;with call bell/phone within reach;with chair alarm set;with family/visitor present   Nurse Communication Mobility status        Time: 1610-9604 OT Time Calculation (min): 29 min  Charges: OT General Charges $OT Visit: 1 Visit OT Treatments $Self Care/Home Management : 8-22 mins  Oleta Mouse, OTD OTR/L  03/22/23, 1:39 PM

## 2023-03-22 NOTE — Plan of Care (Signed)
  Problem: Education: Goal: Knowledge of General Education information will improve Description: Including pain rating scale, medication(s)/side effects and non-pharmacologic comfort measures Outcome: Progressing   Problem: Education: Goal: Knowledge of General Education information will improve Description: Including pain rating scale, medication(s)/side effects and non-pharmacologic comfort measures Outcome: Progressing   Problem: Clinical Measurements: Goal: Ability to maintain clinical measurements within normal limits will improve Outcome: Progressing Goal: Will remain free from infection Outcome: Progressing Goal: Diagnostic test results will improve Outcome: Progressing Goal: Respiratory complications will improve Outcome: Progressing Goal: Cardiovascular complication will be avoided Outcome: Progressing

## 2023-03-22 NOTE — Progress Notes (Signed)
PHARMACY - TOTAL PARENTERAL NUTRITION CONSULT NOTE   Indication: Prolonged ileus  Patient Measurements: Height: 5\' 10"  (177.8 cm) Weight: 81.6 kg (180 lb) IBW/kg (Calculated) : 73 TPN AdjBW (KG): 81.6 Body mass index is 25.83 kg/m. Usual Weight:    Assessment:   Glucose / Insulin: 72-79,  no hx diabetes Ordered SSI q6h  but may be able to d/c once TPN at goal rate and if glucose WNL Electrolytes: K 3.6  Phos 1.9   Renal: Scr 0.98 Hepatic: WNL  Intake / Output;  MIVF: NS@ 169ml/hr GI Imaging:7/27:Free intra-abdominal air. Bowel perforation until proven otherwise.The exact etiology of the free air can not be determined on this examination. There is a mass lesion identified along the ascending colon with focal caliber change of the bowel worrisome for potential neoplasm.There also some abnormal nodes in the right hemi abdominal mesentery and possible liver metastasis. Further workup when appropriate. This could be the source of the free air but indeterminate. There is some fluid adjacent to the ascending colon in the pericolic gutter. GI Surgeries / Procedures:  s/p exploratory laparotomy on 7/28 and right hemicolectomy for right colon perforation likely from colonic obstruction,  feculent peritonitis  -on zosyn   Central access: PICC pending 7/31 TPN start date: plan 7/31  Nutritional Goals: Goal TPN rate is 85 mL/hr (provides 102 g of protein and 2089 kcals per day)  RD Assessment: Estimated Needs Total Energy Estimated Needs: 1900-2200kcal/day Total Protein Estimated Needs: 95-110g/day Total Fluid Estimated Needs: 1.9-2.2L/day  Current Nutrition:  NPO  Plan:  Start TPN at half-rate of 50mL/hr at 1800 At goal rate :   50 g/L amino acids (Travasol 10%)   16% Dextrose   28 g/L Lipids (20% SMOFlipid)  Electrolytes in TPN: Na 85mEq/L, K 39mEq/L, Ca 73mEq/L, Mg 53mEq/L, and Phos 64mmol/L. Cl:Ac 1:1 Add standard MVI and trace elements to TPN Initiate Sensitive q6h SSI and  adjust as needed  Reduce MIVF-  NS to 60 mL/hr at 1800 with start of TPN Thiamine x 5 days -given outside of TPN (start 7/31) Monitor TPN labs x first 3 days then on Mon/Thurs or as needed  Bari Mantis PharmD Clinical Pharmacist 03/22/2023

## 2023-03-22 NOTE — Progress Notes (Signed)
Physical Therapy Treatment Patient Details Name: Don Arias MRN: [id number] DOB: Aug 26, 1929 Today's Date: 03/22/2023   History of Present Illness Pt is a 89 yo M status post exploratory laparotomy and right hemicolectomy for right colon perforation likely from colonic obstruction.  PMH includes skin CA, HTN, and anxiety.    PT Comments  Patient received in bed, OT in room. He is agreeable to PT session. Patient requires mod A for bed mobility. Min +2 for sit to stand from elevated bed. He is able to ambulate 20 feet with RW and min A. Slow pace. He will continue to benefit from skilled PT to improve functional independence, strength and safety.     If plan is discharge home, recommend the following: A lot of help with walking and/or transfers;A lot of help with bathing/dressing/bathroom;Assistance with cooking/housework;Direct supervision/assist for medications management;Help with stairs or ramp for entrance;Assist for transportation   Can travel by private vehicle     No  Equipment Recommendations  Rolling walker (2 wheels)    Recommendations for Other Services       Precautions / Restrictions Precautions Precautions: Fall Precaution Comments: NGT, abdominal JP drain Restrictions Weight Bearing Restrictions: No Other Position/Activity Restrictions: Abdominal surgery with LLQ JP drain and NG tube     Mobility  Bed Mobility Overal bed mobility: Needs Assistance Bed Mobility: Supine to Sit, Sidelying to Sit   Sidelying to sit: Mod assist Supine to sit: Mod assist     General bed mobility comments: patient wants to do things his way and does not want to roll despite cues    Transfers Overall transfer level: Needs assistance Equipment used: Rolling walker (2 wheels) Transfers: Sit to/from Stand Sit to Stand: Min assist, From elevated surface           General transfer comment: increased time and effort, cueing for safe hand placement and technique; pt performing  sit<>stand x1 from EOB with min A +2    Ambulation/Gait Ambulation/Gait assistance: Min guard Gait Distance (Feet): 20 Feet Assistive device: Rolling walker (2 wheels) Gait Pattern/deviations: Step-through pattern, Decreased step length - right, Decreased step length - left, Decreased stride length Gait velocity: decr     General Gait Details: patient with slow, labored gait. Requiring brief standing rest breaks due to pain   Stairs             Wheelchair Mobility     Tilt Bed    Modified Rankin (Stroke Patients Only)       Balance Overall balance assessment: Needs assistance Sitting-balance support: Feet supported Sitting balance-Leahy Scale: Fair     Standing balance support: Bilateral upper extremity supported, During functional activity, Reliant on assistive device for balance Standing balance-Leahy Scale: Fair Standing balance comment: requires AD and hands on assist for safety                            Cognition Arousal/Alertness: Awake/alert Behavior During Therapy: Anxious Overall Cognitive Status: Within Functional Limits for tasks assessed                                 General Comments: a bit anxious of movement 2/2 pain        Exercises      General Comments        Pertinent Vitals/Pain Pain Assessment Pain Assessment: Faces Faces Pain Scale: Hurts whole lot Breathing: occasional  labored breathing, short period of hyperventilation Negative Vocalization: none Facial Expression: smiling or inexpressive Body Language: tense, distressed pacing, fidgeting Consolability: no need to console PAINAD Score: 2 Pain Location: Abdomen Pain Descriptors / Indicators: Sore, Guarding Pain Intervention(s): Monitored during session, Repositioned    Home Living                          Prior Function            PT Goals (current goals can now be found in the care plan section) Acute Rehab PT Goals Patient  Stated Goal: pain control PT Goal Formulation: With patient Time For Goal Achievement: 04/01/23 Potential to Achieve Goals: Good Progress towards PT goals: Progressing toward goals    Frequency    Min 1X/week      PT Plan Current plan remains appropriate    Co-evaluation PT/OT/SLP Co-Evaluation/Treatment: Yes Reason for Co-Treatment: Necessary to address cognition/behavior during functional activity;For patient/therapist safety;To address functional/ADL transfers PT goals addressed during session: Mobility/safety with mobility;Balance;Proper use of DME        AM-PAC PT "6 Clicks" Mobility   Outcome Measure  Help needed turning from your back to your side while in a flat bed without using bedrails?: A Lot Help needed moving from lying on your back to sitting on the side of a flat bed without using bedrails?: A Lot Help needed moving to and from a bed to a chair (including a wheelchair)?: A Little Help needed standing up from a chair using your arms (e.g., wheelchair or bedside chair)?: A Lot Help needed to walk in hospital room?: A Lot Help needed climbing 3-5 steps with a railing? : Total 6 Click Score: 12    End of Session   Activity Tolerance: Patient limited by pain Patient left: in chair;with call bell/phone within reach;with chair alarm set;with family/visitor present Nurse Communication: Mobility status PT Visit Diagnosis: Other abnormalities of gait and mobility (R26.89);Muscle weakness (generalized) (M62.81);Difficulty in walking, not elsewhere classified (R26.2);Pain Pain - part of body:  (abdomen)     Time: 1610-9604 PT Time Calculation (min) (ACUTE ONLY): 24 min  Charges:    $Gait Training: 8-22 mins PT General Charges $$ ACUTE PT VISIT: 1 Visit                      , PT, [facility] 03/22/23,11:05 AM

## 2023-03-23 LAB — GLUCOSE, CAPILLARY
Glucose-Capillary: 139 mg/dL — ABNORMAL HIGH (ref 70–99)
Glucose-Capillary: 156 mg/dL — ABNORMAL HIGH (ref 70–99)
Glucose-Capillary: 161 mg/dL — ABNORMAL HIGH (ref 70–99)
Glucose-Capillary: 202 mg/dL — ABNORMAL HIGH (ref 70–99)

## 2023-03-23 MED ORDER — ACETAMINOPHEN 10 MG/ML IV SOLN
1000.0000 mg | Freq: Four times a day (QID) | INTRAVENOUS | Status: AC
  Administered 2023-03-23 – 2023-03-24 (×3): 1000 mg via INTRAVENOUS
  Filled 2023-03-23 (×4): qty 100

## 2023-03-23 MED ORDER — SODIUM CHLORIDE 0.9 % IV SOLN: INTRAVENOUS | Status: AC

## 2023-03-23 MED ORDER — HYDRALAZINE HCL 20 MG/ML IJ SOLN
5.0000 mg | Freq: Four times a day (QID) | INTRAMUSCULAR | Status: DC | PRN
  Administered 2023-03-23 – 2023-03-24 (×2): 5 mg via INTRAVENOUS
  Filled 2023-03-23 (×2): qty 1

## 2023-03-23 MED ORDER — TRAVASOL 10 % IV SOLN
INTRAVENOUS | Status: AC
  Filled 2023-03-23: qty 720

## 2023-03-23 MED ORDER — POTASSIUM PHOSPHATES 15 MMOLE/5ML IV SOLN
20.0000 mmol | Freq: Once | INTRAVENOUS | Status: AC
  Administered 2023-03-23: 20 mmol via INTRAVENOUS
  Filled 2023-03-23: qty 6.67

## 2023-03-23 NOTE — Progress Notes (Addendum)
Occupational Therapy Treatment Patient Details Name: Don Arias MRN: [id number] DOB: 1929/09/25 Today's Date: 03/23/2023   History of present illness Pt is a 89 yo M status post exploratory laparotomy and right hemicolectomy for right colon perforation likely from colonic obstruction.  PMH includes skin CA, HTN, and anxiety.   OT comments  Chart reviewed, pt greeted in bed, agreeable to OT tx session after pre medicated for pain. Tx session targeted improving functional activity tolerance during functional mobility in prep for improved ADL completion. Pt is fearful of pain, educated on importance of continued progression of mobility while acutely admitted to decrease further regression of functional status. Continued step by step vcs required for bed mobility with MOD A, short amb transfers in room with RW with CGA. Toileting completed with set up at bed level. Vss throughout. Pt is left in bedside chair, all needs met. OT will continue to follow acutely.    Recommendations for follow up therapy are one component of a multi-disciplinary discharge planning process, led by the attending physician.  Recommendations may be updated based on patient status, additional functional criteria and insurance authorization.    Assistance Recommended at Discharge Frequent or constant Supervision/Assistance  Patient can return home with the following  A lot of help with walking and/or transfers;A lot of help with bathing/dressing/bathroom;Assist for transportation;Assistance with cooking/housework;Help with stairs or ramp for entrance   Equipment Recommendations  Other (comment) (2WW)    Recommendations for Other Services      Precautions / Restrictions Precautions Precautions: Fall Precaution Comments: NG, abdominal JP drain Restrictions Weight Bearing Restrictions: No Other Position/Activity Restrictions: per secure chat with surgeon ok for abd binder       Mobility Bed Mobility Overal bed  mobility: Needs Assistance Bed Mobility: Rolling, Sidelying to Sit Rolling: Supervision Sidelying to sit: Mod assist       General bed mobility comments: step by step vcs for body mechanics- pt requiring significant education re: body mechanics (log roll vs crunching up) is fearful of being in more pain    Transfers Overall transfer level: Needs assistance Equipment used: Rolling walker (2 wheels) Transfers: Sit to/from Stand Sit to Stand: Min assist (from regular bed height with step by step vcs for transfer technique/RW use)                 Balance Overall balance assessment: Needs assistance Sitting-balance support: Feet supported Sitting balance-Leahy Scale: Good     Standing balance support: Bilateral upper extremity supported, During functional activity, Reliant on assistive device for balance Standing balance-Leahy Scale: Fair                             ADL either performed or assessed with clinical judgement   ADL Overall ADL's : Needs assistance/impaired Eating/Feeding: NPO   Grooming: Wash/dry face;Sitting;Set up               Lower Body Dressing: Maximal assistance   Toilet Transfer: Min guard;Rolling walker (2 wheels) Toilet Transfer Details (indicate cue type and reason): simulated to bedside chair, short amb transfer Toileting- Clothing Manipulation and Hygiene: Set up Toileting - Clothing Manipulation Details (indicate cue type and reason): urinal in bed     Functional mobility during ADLs: Min guard;Rolling walker (2 wheels) (short amb transfers on this date) General ADL Comments: increased time required for all tasks    Extremity/Trunk Assessment  Cognition Arousal/Alertness: Awake/alert Behavior During Therapy: Anxious Overall Cognitive Status: Within Functional Limits for tasks assessed                                          Exercises Other Exercises Other Exercises: edu pt re  importance of continued progression of mobility, pain management technqiues, prn use of abdominal binder per discussion with team       General Comments abd incision ?leakage, nurse notified, jp drain intact pre/post session, ng tube to suction disconnected pre session, nurse aware post session    Pertinent Vitals/ Pain       Pain Assessment Pain Assessment: 0-10 Pain Score: 9  Pain Location: abdomen with mobility Pain Descriptors / Indicators: Discomfort, Sharp Pain Intervention(s): Monitored during session, Repositioned, Premedicated before session  Home Living                                          Prior Functioning/Environment              Frequency  Min 1X/week        Progress Toward Goals  OT Goals(current goals can now be found in the care plan section)  Progress towards OT goals: Progressing toward goals     Plan Discharge plan remains appropriate;Frequency remains appropriate    Co-evaluation                 AM-PAC OT "6 Clicks" Daily Activity     Outcome Measure   Help from another person eating meals?: None (ice chips per protocol) Help from another person taking care of personal grooming?: None Help from another person toileting, which includes using toliet, bedpan, or urinal?: A Little Help from another person bathing (including washing, rinsing, drying)?: A Lot Help from another person to put on and taking off regular upper body clothing?: A Little Help from another person to put on and taking off regular lower body clothing?: A Lot 6 Click Score: 18    End of Session Equipment Utilized During Treatment: Rolling walker (2 wheels)  OT Visit Diagnosis: Other abnormalities of gait and mobility (R26.89);Muscle weakness (generalized) (M62.81);Pain   Activity Tolerance Patient tolerated treatment well   Patient Left in chair;with call bell/phone within reach;with chair alarm set;with family/visitor present   Nurse  Communication Mobility status        Time: 2706-2376 OT Time Calculation (min): 42 min  Charges: OT General Charges $OT Visit: 1 Visit OT Treatments $Self Care/Home Management : 23-37 mins $Therapeutic Activity: 8-22 mins Oleta Mouse, OTD OTR/L  03/23/23, 3:55 PM

## 2023-03-23 NOTE — Progress Notes (Signed)
[facility] SURGICAL PROGRESS NOTE  Hospital Day(s): 5.   Post op day(s): 5 Days Post-Op.   Interval History:  Patient seen and examined No acute events or new complaints overnight.  Patient reports still with abdominal soreness but improving No fever, chills, nausea, emesis Remains without leukocytosis; 7.3K Hgb to 10.6; stable Renal function normalized; sCr - 0.91; UO - 800 ccs Mild hypokalemia to 3.4; hypophosphatemia to 2.3 NGT output not recorded; has about 200 ccs in canister Surgical drain with 140 ccs in last 24 hours; serosanguinous He is NPO Still without flatus Working with therapies   Vital signs in last 24 hours: [min-max] current  Temp:  [98.1 F (36.7 C)-99.1 F (37.3 C)] 99.1 F (37.3 C) (08/01 0412) Pulse Rate:  [63-72] 72 (08/01 0412) Resp:  [18-20] 18 (08/01 0412) BP: (166-196)/(79-93) 196/93 (08/01 0412) SpO2:  [95 %-98 %] 96 % (08/01 0412) Weight:  [83.6 kg] 83.6 kg (08/01 0415)     Height: 5\' 10"  (177.8 cm) Weight: 83.6 kg BMI (Calculated): 26.44   Intake/Output last 2 shifts:  07/31 0701 - 08/01 0700 In: -  Out: 940 [Urine:800; Drains:140]   Physical Exam:  Constitutional: alert, cooperative and no distress  HEENT: NGT in place; output less bilious Respiratory: breathing non-labored at rest  Cardiovascular: regular rate and sinus rhythm  Gastrointestinal: Soft, incisional soreness, non-distended, no rebound/guarding. Surgical drain in LLQ; output serosanguinous  Integumentary: Laparotomy is intact with staples, penrose in place, there is serosanguinous drainage to dependent portion.   Labs:     Latest Ref Rng & Units 03/23/2023    5:02 AM 03/22/2023    4:50 AM 03/20/2023    4:50 AM  CBC  WBC 4.0 - 10.5 K/uL 7.3  10.2  8.9   Hemoglobin 13.0 - 17.0 g/dL 16.1  09.6  04.5   Hematocrit 39.0 - 52.0 % 31.8  33.2  33.9   Platelets 150 - 400 K/uL 231  232  190       Latest Ref Rng & Units 03/23/2023    5:02 AM 03/22/2023    7:46 PM  03/22/2023    4:50 AM  CMP  Glucose 70 - 99 mg/dL 409   79   BUN 8 - 23 mg/dL 16   18   Creatinine 8.11 - 1.24 mg/dL 9.14   7.82   Sodium 956 - 145 mmol/L 138   141   Potassium 3.5 - 5.1 mmol/L 3.4  3.4  3.6   Chloride 98 - 111 mmol/L 110   112   CO2 22 - 32 mmol/L 22   21   Calcium 8.9 - 10.3 mg/dL 8.1   8.0   Total Protein 6.5 - 8.1 g/dL 5.0     Total Bilirubin 0.3 - 1.2 mg/dL 0.6     Alkaline Phos 38 - 126 U/L 39     AST 15 - 41 U/L 14     ALT 0 - 44 U/L 13        Imaging studies: No new pertinent imaging studies   Assessment/Plan:  89 y.o. male with resolved AKI, otherwise awaiting ROBF, 5 Days Post-Op s/p exploratory laparotomy and right colectomy secondary to perforated right colon cancer    - Continue TPN; advance to goal - Continue NPO - Continue NGT decompression; LIS; monitor and record output - Continue IV Abx (Zosyn) - Monitor abdominal examination; on-going bowel function   - Continue surgical drain; monitor and record output  - Pain control prn; antiemetics  prn - Mobilize with therapies; doing well considering  - Follow up pathology when available   All of the above findings and recommendations were discussed with the patient, and the medical team, and all of patient's questions were answered to his expressed satisfaction.  -- [name], PA-C [facility] 03/23/2023, 7:43 AM M-F: 7am - 4pm

## 2023-03-23 NOTE — Plan of Care (Signed)

## 2023-03-23 NOTE — Progress Notes (Addendum)
PHARMACY - TOTAL PARENTERAL NUTRITION CONSULT NOTE   Indication: Prolonged ileus  Patient Measurements: Height: 5\' 10"  (177.8 cm) Weight: 83.6 kg (184 lb 4.9 oz) IBW/kg (Calculated) : 73 TPN AdjBW (KG): 81.6 Body mass index is 26.44 kg/m. Usual Weight:    Assessment:   Glucose / Insulin:  No apparent history of diabetes Ordered sensitive SSI q6h Limited POC BG data available given proximity to new TPN start 3u SSI required last 24h Electrolytes: Mild hypokalemia, mild hypophosphatemia Renal: Scr < 1 Hepatic: Within normal limits  Intake / Output:  MIVF: NS at 60 cc/hr GI Imaging: 7/27:Free intra-abdominal air. Bowel perforation until proven otherwise.The exact etiology of the free air can not be determined on this examination. There is a mass lesion identified along the ascending colon with focal caliber change of the bowel worrisome for potential neoplasm.There also some abnormal nodes in the right hemi abdominal mesentery and possible liver metastasis. Further workup when appropriate. This could be the source of the free air but indeterminate. There is some fluid adjacent to the ascending colon in the pericolic gutter. GI Surgeries / Procedures:  Exploratory laparotomy on 7/28 and right hemicolectomy for right colon perforation likely from colonic obstruction, feculent peritonitis   Central access: PICC 7/31 TPN start date: 7/31  Nutritional Goals: Goal TPN rate is 85 mL/hr (provides 102 g of protein and 2089 kcals per day)  RD Assessment: Estimated Needs Total Energy Estimated Needs: 1900-2200kcal/day Total Protein Estimated Needs: 95-110g/day Total Fluid Estimated Needs: 1.9-2.2L/day  Current Nutrition:  NPO  Plan:  --Advance TPN to ~ 2/3 rate at 60 mL/hr at 1800 --Electrolytes in TPN: Na 18mEq/L, K 86mEq/L, Ca 80mEq/L, Mg 57mEq/L, and Phos 85mmol/L. Cl:Ac 1:1 Give potassium phosphate 20 mmol IV x 1 outside of TPN today --Add standard MVI and trace elements to  TPN --Continue Sensitive q6h SSI and adjust as needed  --Reduce NS to 40 mL/hr at 1800 --Thiamine 100 mg IV x 5 days --Monitor TPN labs x first 3 days then on Mon/Thurs or as needed  Tressie Ellis 03/23/2023

## 2023-03-24 LAB — GLUCOSE, CAPILLARY
Glucose-Capillary: 167 mg/dL — ABNORMAL HIGH (ref 70–99)
Glucose-Capillary: 170 mg/dL — ABNORMAL HIGH (ref 70–99)
Glucose-Capillary: 151 mg/dL — ABNORMAL HIGH (ref 70–99)
Glucose-Capillary: 133 mg/dL — ABNORMAL HIGH (ref 70–99)

## 2023-03-24 MED ORDER — TRAVASOL 10 % IV SOLN
INTRAVENOUS | Status: AC
  Filled 2023-03-24: qty 720

## 2023-03-24 MED ORDER — OXYCODONE HCL 5 MG PO TABS
5.0000 mg | ORAL_TABLET | ORAL | Status: DC | PRN
  Administered 2023-03-24: 5 mg via ORAL
  Administered 2023-03-25 – 2023-04-01 (×17): 10 mg via ORAL
  Administered 2023-04-02: 5 mg via ORAL
  Filled 2023-03-24 (×3): qty 2
  Filled 2023-03-24: qty 1
  Filled 2023-03-24 (×9): qty 2
  Filled 2023-03-24: qty 1
  Filled 2023-03-24 (×6): qty 2

## 2023-03-24 MED ORDER — POTASSIUM PHOSPHATES 15 MMOLE/5ML IV SOLN
30.0000 mmol | Freq: Once | INTRAVENOUS | Status: AC
  Administered 2023-03-24: 30 mmol via INTRAVENOUS
  Filled 2023-03-24: qty 10

## 2023-03-24 MED ORDER — ACETAMINOPHEN 500 MG PO TABS
1000.0000 mg | ORAL_TABLET | Freq: Four times a day (QID) | ORAL | Status: DC | PRN
  Administered 2023-03-29 – 2023-04-03 (×2): 1000 mg via ORAL
  Filled 2023-03-24 (×2): qty 2

## 2023-03-24 NOTE — Progress Notes (Signed)
Occupational Therapy Treatment Patient Details Name: Don Arias MRN: [id number] DOB: 1929-10-19 Today's Date: 03/24/2023   History of present illness Pt is a 89 yo M status post exploratory laparotomy and right hemicolectomy for right colon perforation likely from colonic obstruction.  PMH includes skin CA, HTN, and anxiety.   OT comments  Pt seen for OT tx. Pt endorsing anxiety at start of session but states that it's also getting better and he is feeling better each day. Pt eager to participate despite anxiety. Pt noting 7/10 abdominal pain during session, worse with transitional movements. Pt required MIN A for bed mobility with VC for body mechanics to minimize abdominal pain. MIN A For UB dressing. Pt required 5+min to recover seated EOB before standing with MIN A with VC for hand placement. Pt demonstrating fair dynamic balance with RW for short distance mobility in the room. Pt demonstrating progress towards goals. Continues to benefit from skilled OT services.    Recommendations for follow up therapy are one component of a multi-disciplinary discharge planning process, led by the attending physician.  Recommendations may be updated based on patient status, additional functional criteria and insurance authorization.    Assistance Recommended at Discharge Frequent or constant Supervision/Assistance  Patient can return home with the following  A lot of help with bathing/dressing/bathroom;Assist for transportation;Assistance with cooking/housework;Help with stairs or ramp for entrance;A little help with walking and/or transfers   Equipment Recommendations  Other (comment) (2ww)    Recommendations for Other Services      Precautions / Restrictions Precautions Precautions: Fall Precaution Comments: NG, abdominal JP drain Restrictions Weight Bearing Restrictions: No Other Position/Activity Restrictions: per secure chat with surgeon ok for abd binder       Mobility Bed  Mobility Overal bed mobility: Needs Assistance Bed Mobility: Rolling, Sidelying to Sit Rolling: Supervision Sidelying to sit: Min assist       General bed mobility comments: slow, cautious    Transfers Overall transfer level: Needs assistance Equipment used: Rolling walker (2 wheels) Transfers: Sit to/from Stand Sit to Stand: Min assist           General transfer comment: increased time/effort to perform, VC for hand placement     Balance Overall balance assessment: Needs assistance Sitting-balance support: Feet supported Sitting balance-Leahy Scale: Good     Standing balance support: Bilateral upper extremity supported, During functional activity, Reliant on assistive device for balance Standing balance-Leahy Scale: Fair                             ADL either performed or assessed with clinical judgement   ADL Overall ADL's : Needs assistance/impaired     Grooming: Wash/dry face;Sitting;Set up;Wash/dry hands           Upper Body Dressing : Minimal assistance;Sitting                   Functional mobility during ADLs: Min guard;Rolling walker (2 wheels)      Extremity/Trunk Assessment              Vision       Perception     Praxis      Cognition Arousal/Alertness: Awake/alert Behavior During Therapy: WFL for tasks assessed/performed Overall Cognitive Status: Within Functional Limits for tasks assessed  General Comments: pt endorsing anxiety        Exercises      Shoulder Instructions       General Comments      Pertinent Vitals/ Pain       Pain Assessment Pain Assessment: 0-10 Pain Score: 7  Pain Location: abdomen with mobility Pain Descriptors / Indicators: Discomfort, Sharp, Guarding, Grimacing Pain Intervention(s): Limited activity within patient's tolerance, Monitored during session, Premedicated before session, Repositioned, Patient requesting pain meds-RN  notified  Home Living                                          Prior Functioning/Environment              Frequency  Min 1X/week        Progress Toward Goals  OT Goals(current goals can now be found in the care plan section)  Progress towards OT goals: Progressing toward goals  Acute Rehab OT Goals Patient Stated Goal: go home OT Goal Formulation: With patient/family Time For Goal Achievement: 04/03/23 Potential to Achieve Goals: Good  Plan Discharge plan remains appropriate;Frequency remains appropriate    Co-evaluation                 AM-PAC OT "6 Clicks" Daily Activity     Outcome Measure   Help from another person eating meals?: None Help from another person taking care of personal grooming?: None Help from another person toileting, which includes using toliet, bedpan, or urinal?: A Little Help from another person bathing (including washing, rinsing, drying)?: A Lot Help from another person to put on and taking off regular upper body clothing?: A Little Help from another person to put on and taking off regular lower body clothing?: A Lot 6 Click Score: 18    End of Session Equipment Utilized During Treatment: Rolling walker (2 wheels)  OT Visit Diagnosis: Other abnormalities of gait and mobility (R26.89);Muscle weakness (generalized) (M62.81);Pain Pain - Right/Left:  (abdomen)   Activity Tolerance Patient tolerated treatment well   Patient Left in chair;with call bell/phone within reach;with chair alarm set   Nurse Communication Mobility status        Time: 8756-4332 OT Time Calculation (min): 28 min  Charges: OT General Charges $OT Visit: 1 Visit OT Treatments $Self Care/Home Management : 23-37 mins  Arman Filter., MPH, MS, OTR/L ascom [phone] 03/24/23, 1:34 PM

## 2023-03-24 NOTE — Plan of Care (Signed)
  Problem: Activity: Goal: Risk for activity intolerance will decrease Outcome: Progressing   Problem: Nutrition: Goal: Adequate nutrition will be maintained Outcome: Progressing   

## 2023-03-24 NOTE — Progress Notes (Signed)
[facility] SURGICAL PROGRESS NOTE  Hospital Day(s): 6.   Post op day(s): 6 Days Post-Op.   Interval History:  Patient seen and examined No acute events or new complaints overnight.  Patient reports he is doing well Still with abdominal soreness but seems improved slightly No fever, chills, nausea, emesis Mild hypokalemia to 3.0; hypophosphatemia to 2.0 NGT output 150 ccs in last 24 hours recorded  Surgical drain with 330 ccs in last 24 hours; serosanguinous He is NPO He did start to pass flatus last night and this has reportedly continued into this AM Working with therapies   Vital signs in last 24 hours: [min-max] current  Temp:  [97.8 F (36.6 C)-98.2 F (36.8 C)] 98.2 F (36.8 C) (08/02 0341) Pulse Rate:  [65-71] 65 (08/02 0341) Resp:  [18-20] 18 (08/02 0341) BP: (157-191)/(77-90) 160/77 (08/02 0547) SpO2:  [95 %-98 %] 97 % (08/02 0341) Weight:  [83.7 kg] 83.7 kg (08/02 0500)     Height: 5\' 10"  (177.8 cm) Weight: 83.7 kg BMI (Calculated): 26.48   Intake/Output last 2 shifts:  08/01 0701 - 08/02 0700 In: 2108.9 [I.V.:1052.8; IV Piggyback:1056.2] Out: 1980 [Urine:1500; Emesis/NG output:150; Drains:330]   Physical Exam:  Constitutional: alert, cooperative and no distress  HEENT: NGT in place; output clearing Respiratory: breathing non-labored at rest  Cardiovascular: regular rate and sinus rhythm  Gastrointestinal: Soft, incisional soreness, non-distended, no rebound/guarding. Surgical drain in LLQ; output serosanguinous  Integumentary: Laparotomy is intact with staples, penrose in place, dressing intact this AM  Labs:     Latest Ref Rng & Units 03/23/2023    5:02 AM 03/22/2023    4:50 AM 03/20/2023    4:50 AM  CBC  WBC 4.0 - 10.5 K/uL 7.3  10.2  8.9   Hemoglobin 13.0 - 17.0 g/dL 10.2  72.5  36.6   Hematocrit 39.0 - 52.0 % 31.8  33.2  33.9   Platelets 150 - 400 K/uL 231  232  190       Latest Ref Rng & Units 03/24/2023    3:50 AM 03/23/2023    5:02  AM 03/22/2023    7:46 PM  CMP  Glucose 70 - 99 mg/dL 440  347    BUN 8 - 23 mg/dL 11  16    Creatinine 4.25 - 1.24 mg/dL 9.56  3.87    Sodium 564 - 145 mmol/L 139  138    Potassium 3.5 - 5.1 mmol/L 3.0  3.4  3.4   Chloride 98 - 111 mmol/L 108  110    CO2 22 - 32 mmol/L 24  22    Calcium 8.9 - 10.3 mg/dL 8.3  8.1    Total Protein 6.5 - 8.1 g/dL  5.0    Total Bilirubin 0.3 - 1.2 mg/dL  0.6    Alkaline Phos 38 - 126 U/L  39    AST 15 - 41 U/L  14    ALT 0 - 44 U/L  13       Imaging studies: No new pertinent imaging studies   Assessment/Plan:  89 y.o. male 6 Days Post-Op s/p exploratory laparotomy and right colectomy secondary to perforated right colon cancer    - Given continued ROBF, will proceed with NGT clamping trial. NGT clamped this morning. Check residuals at 1200. If residuals are <150 ccs, we can remove this and start diet  - Continue NPO for now pending NGT clamping trial  - Continue TPN; at goal - Continue IV Abx (Zosyn) -  Monitor abdominal examination; on-going bowel function   - Continue surgical drain; monitor and record output  - Pain control prn; antiemetics prn - Mobilize with therapies; doing well considering  - Follow up pathology when available   All of the above findings and recommendations were discussed with the patient, and the medical team, and all of patient's questions were answered to his expressed satisfaction.  -- [name], PA-C [facility] 03/24/2023, 7:09 AM M-F: 7am - 4pm

## 2023-03-24 NOTE — Plan of Care (Signed)

## 2023-03-24 NOTE — Progress Notes (Signed)
PHARMACY - TOTAL PARENTERAL NUTRITION CONSULT NOTE   Indication: Prolonged ileus  Patient Measurements: Height: 5\' 10"  (177.8 cm) Weight: 83.7 kg (184 lb 8.4 oz) IBW/kg (Calculated) : 73 TPN AdjBW (KG): 81.6 Body mass index is 26.48 kg/m. Usual Weight:    Assessment:   Glucose / Insulin:  No apparent history of diabetes Ordered sensitive SSI q6h BG last 24h: 139 - 202 10u SSI required last 24h Electrolytes: Hypokalemia, hypophosphatemia Renal: Scr < 1 Hepatic: Within normal limits  Intake / Output:  MIVF: NS at 60 cc/hr GI Imaging: 7/27:Free intra-abdominal air. Bowel perforation until proven otherwise.The exact etiology of the free air can not be determined on this examination. There is a mass lesion identified along the ascending colon with focal caliber change of the bowel worrisome for potential neoplasm.There also some abnormal nodes in the right hemi abdominal mesentery and possible liver metastasis. Further workup when appropriate. This could be the source of the free air but indeterminate. There is some fluid adjacent to the ascending colon in the pericolic gutter. GI Surgeries / Procedures:  Exploratory laparotomy on 7/28 and right hemicolectomy for right colon perforation likely from colonic obstruction, feculent peritonitis   Central access: PICC 7/31 TPN start date: 7/31  Nutritional Goals: Goal TPN rate is 85 mL/hr (provides 102 g of protein and 2089 kcals per day)  RD Assessment: Estimated Needs Total Energy Estimated Needs: 1900-2200kcal/day Total Protein Estimated Needs: 95-110g/day Total Fluid Estimated Needs: 1.9-2.2L/day  Current Nutrition:  NPO  Per surgery note 8/2: "Given continued ROBF, will proceed with NGT clamping trial. NGT clamped this morning. Check residuals at 1200. If residuals are <150 ccs, we can remove this and start diet"  Plan:  --Continue TPN at ~ 2/3 rate of 60 mL/hr at 1800 Hold off on advancing today given ongoing signs of  refeeding as evidenced by persistent hypokalemia / hypophosphatemia --Electrolytes in TPN: Na 67mEq/L, K 95mEq/L, Ca 58mEq/L, Mg 35mEq/L, and Phos 39mmol/L. Cl:Ac 1:1 Give potassium phosphate 30 mmol IV x 1 outside of TPN today --Add standard MVI and trace elements to TPN --Continue Sensitive q6h SSI and adjust as needed If diet un-able to be advanced and TPN is continued, may need insulin added to TPN bag once at full rate --Continue NS to 40 mL/hr at 1800 --Thiamine 100 mg IV x 5 days --Monitor TPN labs on Mon/Thurs or as needed  Tressie Ellis 03/24/2023

## 2023-03-24 NOTE — Care Management Important Message (Signed)
Important Message  Patient Details  Name: Don Arias MRN: [id number] Date of Birth: 1930-04-09   Medicare Important Message Given:  Yes     Olegario Messier A  03/24/2023, 11:50 AM

## 2023-03-24 NOTE — Progress Notes (Signed)
Physical Therapy Treatment Patient Details Name: Don Arias MRN: [id number] DOB: [birth year] Today's Date: 03/24/2023   History of Present Illness Pt is a 89 yo M status post exploratory laparotomy and right hemicolectomy for right colon perforation likely from colonic obstruction.  PMH includes skin CA, HTN, and anxiety.    PT Comments  Pt was sitting in recliner upon arrival. He agrees to session however limited session. Pt only willing to stand and take steps around bed (~ 43ft total). Pain limiting overall. Did discuss post acute care and pt is now agreeable to STR placement. Pt will continue to benefit from skilled PT to maximize independence and safety with all ADLs.    If plan is discharge home, recommend the following: A lot of help with bathing/dressing/bathroom;Assistance with cooking/housework;Direct supervision/assist for medications management;Help with stairs or ramp for entrance;Assist for transportation;A little help with walking and/or transfers     Equipment Recommendations  Other (comment) (Defer to next level of care)       Precautions / Restrictions Precautions Precautions: Fall Precaution Comments: JP drain Restrictions Weight Bearing Restrictions: No Other Position/Activity Restrictions: per secure chat with surgeon ok for abd binder     Mobility  Bed Mobility Overal bed mobility: Needs Assistance Bed Mobility: Sit to Supine, Sit to Sidelying, Rolling Rolling: Supervision  Sit to supine: Min assist Sit to sidelying: Min assist   Transfers Overall transfer level: Needs assistance Equipment used: Rolling walker (2 wheels) Transfers: Sit to/from Stand Sit to Stand: Min assist   Ambulation/Gait Ambulation/Gait assistance: Min guard Gait Distance (Feet): 8 Feet Assistive device: Rolling walker (2 wheels) Gait Pattern/deviations: Step-through pattern, Decreased step length - right, Decreased step length - left, Decreased stride length, Antalgic Gait  velocity: decreased  General Gait Details: pt unwilling to attempt further ambulation but was able to ambulate around bed with CGA only    Balance Overall balance assessment: Needs assistance Sitting-balance support: Feet supported Sitting balance-Leahy Scale: Good     Standing balance support: Bilateral upper extremity supported, During functional activity, Reliant on assistive device for balance Standing balance-Leahy Scale: Fair      Cognition Arousal/Alertness: Awake/alert Behavior During Therapy: WFL for tasks assessed/performed Overall Cognitive Status: Within Functional Limits for tasks assessed      General Comments: Pt is A and O but still has severe anxiety about mobility               Pertinent Vitals/Pain Pain Assessment Pain Assessment: 0-10 Pain Score: 6  Pain Location: abdomen with mobility Pain Descriptors / Indicators: Discomfort, Sharp, Guarding, Grimacing Pain Intervention(s): Limited activity within patient's tolerance, Monitored during session, Premedicated before session, Repositioned     PT Goals (current goals can now be found in the care plan section) Acute Rehab PT Goals Patient Stated Goal: go home Progress towards PT goals: Progressing toward goals    Frequency    Min 1X/week      PT Plan Current plan remains appropriate    Co-evaluation     PT goals addressed during session: Mobility/safety with mobility;Balance;Strengthening/ROM;Proper use of DME        AM-PAC PT "6 Clicks" Mobility   Outcome Measure  Help needed turning from your back to your side while in a flat bed without using bedrails?: A Little Help needed moving from lying on your back to sitting on the side of a flat bed without using bedrails?: A Lot Help needed moving to and from a bed to a chair (including a wheelchair)?: A  Lot Help needed standing up from a chair using your arms (e.g., wheelchair or bedside chair)?: A Lot Help needed to walk in hospital room?:  A Little Help needed climbing 3-5 steps with a railing? : A Lot 6 Click Score: 14    End of Session   Activity Tolerance: Patient tolerated treatment well;Patient limited by pain Patient left: in bed;with call bell/phone within reach;with bed alarm set Nurse Communication: Mobility status PT Visit Diagnosis: Other abnormalities of gait and mobility (R26.89);Muscle weakness (generalized) (M62.81);Difficulty in walking, not elsewhere classified (R26.2);Pain     Time: 3329-5188 PT Time Calculation (min) (ACUTE ONLY): 19 min  Charges:    $Therapeutic Activity: 8-22 mins PT General Charges $$ ACUTE PT VISIT: 1 Visit                     Jetta Lout PTA 03/24/23, 4:22 PM

## 2023-03-24 NOTE — Progress Notes (Signed)
Physical Therapy Treatment Patient Details Name: Don Arias MRN: [id number] DOB: 25-Jan-1930 Today's Date: 03/24/2023   History of Present Illness Pt is a 89 yo M status post exploratory laparotomy and right hemicolectomy for right colon perforation likely from colonic obstruction.  PMH includes skin CA, HTN, and anxiety.    PT Comments  Pt was seated in recliner upon arrival. Abdominal binder ordered however improper size. Discussed with RN staff getting another binder to have proper fit. Pt is agreeable to session however requires increased time for all mobility, transfers, and gait. Mostly pain limited but was able to tolerate gait in room. Most assistance required with returning to bed after ambulating. Discussed recs and post acute care. DC recs remain appropriate. Pt will benefit from continued skilled PT to maximize his independence and safety with all ADLs.    If plan is discharge home, recommend the following: A lot of help with bathing/dressing/bathroom;Assistance with cooking/housework;Direct supervision/assist for medications management;Help with stairs or ramp for entrance;Assist for transportation;A little help with walking and/or transfers     Equipment Recommendations  Rolling walker (2 wheels);BSC/3in1       Precautions / Restrictions Precautions Precautions: Fall Precaution Comments: NG, abdominal JP drain Restrictions Weight Bearing Restrictions: No     Mobility  Bed Mobility Overal bed mobility: Needs Assistance Bed Mobility: Sit to Supine, Sit to Sidelying  Sit to supine: Min assist Sit to sidelying: Min assist   Transfers Overall transfer level: Needs assistance Equipment used: Rolling walker (2 wheels) Transfers: Sit to/from Stand Sit to Stand: Min guard, Min assist     Ambulation/Gait Ambulation/Gait assistance: Min guard Assistive device: Rolling walker (2 wheels) Gait Pattern/deviations: Step-through pattern, Decreased step length - right,  Decreased step length - left, Decreased stride length  General Gait Details: pt was able to ambulate in room ~ 25 ft total. limited by pain. extremely slow cadence   Balance Overall balance assessment: Needs assistance Sitting-balance support: Feet supported Sitting balance-Leahy Scale: Good     Standing balance support: Bilateral upper extremity supported, During functional activity, Reliant on assistive device for balance Standing balance-Leahy Scale: Fair      Cognition Arousal/Alertness: Awake/alert Behavior During Therapy: WFL for tasks assessed/performed Overall Cognitive Status: Within Functional Limits for tasks assessed    General Comments: pt is A and O but does have fear about anxiety/ movement               Pertinent Vitals/Pain Pain Assessment Pain Location: abdomen with mobility Pain Descriptors / Indicators: Discomfort, Sharp     PT Goals (current goals can now be found in the care plan section) Acute Rehab PT Goals Patient Stated Goal: go home    Frequency    Min 1X/week    PT Plan Current plan remains appropriate    Co-evaluation     PT goals addressed during session: Mobility/safety with mobility;Balance;Proper use of DME        AM-PAC PT "6 Clicks" Mobility   Outcome Measure  Help needed turning from your back to your side while in a flat bed without using bedrails?: A Little Help needed moving from lying on your back to sitting on the side of a flat bed without using bedrails?: A Lot Help needed moving to and from a bed to a chair (including a wheelchair)?: A Lot Help needed standing up from a chair using your arms (e.g., wheelchair or bedside chair)?: A Lot Help needed to walk in hospital room?: A Little Help needed climbing  3-5 steps with a railing? : A Lot 6 Click Score: 14    End of Session   Activity Tolerance: Patient tolerated treatment well;Patient limited by pain Patient left: in bed;with call bell/phone within reach;with bed  alarm set Nurse Communication: Mobility status PT Visit Diagnosis: Other abnormalities of gait and mobility (R26.89);Muscle weakness (generalized) (M62.81);Difficulty in walking, not elsewhere classified (R26.2);Pain     Time:  -     Charges:                 Jetta Lout PTA 03/24/23, 7:18 AM

## 2023-03-25 LAB — GLUCOSE, CAPILLARY
Glucose-Capillary: 159 mg/dL — ABNORMAL HIGH (ref 70–99)
Glucose-Capillary: 157 mg/dL — ABNORMAL HIGH (ref 70–99)
Glucose-Capillary: 160 mg/dL — ABNORMAL HIGH (ref 70–99)
Glucose-Capillary: 162 mg/dL — ABNORMAL HIGH (ref 70–99)
Glucose-Capillary: 165 mg/dL — ABNORMAL HIGH (ref 70–99)

## 2023-03-25 MED ORDER — TRAVASOL 10 % IV SOLN
INTRAVENOUS | Status: AC
  Filled 2023-03-25: qty 1020

## 2023-03-25 NOTE — TOC Progression Note (Signed)
Transition of Care [facility]) - Progression Note    Patient Details  Name: Don Arias MRN: [id number] Date of Birth: [birth year]  Transition of Care [facility]) CM/SW Contact  Susa Simmonds, [place] Phone Number: 03/25/2023, 3:43 PM  Clinical Narrative:   CSW spoke with patients daughter Tressia Miners [phone] who accepted the [facility] bed offer. CSW reached out to Tammy with admissions at [facility]. CSW is waiting a response back.     Expected Discharge Plan: Skilled Nursing Facility Barriers to Discharge: Continued Medical Work up  Expected Discharge Plan and Services     Post Acute Care Choice:  (TBD) Living arrangements for the past 2 months: Single Family Home                                       Social Determinants of Health (SDOH) Interventions SDOH Screenings   Food Insecurity: No Food Insecurity (03/19/2023)  Housing: Low Risk  (03/19/2023)  Transportation Needs: No Transportation Needs (03/19/2023)  Utilities: Not At Risk (03/19/2023)  Tobacco Use: Medium Risk (03/18/2023)    Readmission Risk Interventions     No data to display

## 2023-03-25 NOTE — Progress Notes (Signed)
Occupational Therapy Treatment Patient Details Name: Don Arias MRN: [id number] DOB: 07/06/1930 Today's Date: 03/25/2023   History of present illness Pt is a 89 yo M status post exploratory laparotomy and right hemicolectomy for right colon perforation likely from colonic obstruction.  PMH includes skin CA, HTN, and anxiety.   OT comments  Pt received semi-reclined in bed. Appearing alert; premedicated; willing to work with OT on functional mobility in the room. T/f with close SBA at RW to standing; walked approx 15 feet (to door and back). See flowsheet below for further details of session. Left semi-reclined in bed with all needs in reach.  Patient will benefit from continued OT while in acute care.    Recommendations for follow up therapy are one component of a multi-disciplinary discharge planning process, led by the attending physician.  Recommendations may be updated based on patient status, additional functional criteria and insurance authorization.    Assistance Recommended at Discharge Frequent or constant Supervision/Assistance  Patient can return home with the following  A lot of help with bathing/dressing/bathroom;Assist for transportation;Assistance with cooking/housework;Help with stairs or ramp for entrance;A little help with walking and/or transfers   Equipment Recommendations  Other (comment) (RW)    Recommendations for Other Services      Precautions / Restrictions Precautions Precautions: Fall Precaution Comments: JP drain Restrictions Weight Bearing Restrictions: No       Mobility Bed Mobility Overal bed mobility: Needs Assistance Bed Mobility: Supine to Sit, Sit to Supine     Supine to sit: Supervision, HOB elevated Sit to supine: Supervision   General bed mobility comments: Pt moving very slowly, but able to do bed mobility with use of rails without OT assistance; minimal cues given    Transfers Overall transfer level: Needs  assistance Equipment used: Rolling walker (2 wheels) Transfers: Sit to/from Stand Sit to Stand: Min guard           General transfer comment: bed regular height; extra time; cue for hand placement (one hand on RW and one on bed)     Balance Overall balance assessment: Mild deficits observed, not formally tested                                         ADL either performed or assessed with clinical judgement   ADL Overall ADL's : Needs assistance/impaired                                       General ADL Comments: Did not practice ADLs today; pt agreeable to functional mobility in the room since he has not been out of bed yet today. OT did assist pt in putting lotion on his back while he was seated EOB.    Extremity/Trunk Assessment Upper Extremity Assessment Upper Extremity Assessment: Generalized weakness   Lower Extremity Assessment Lower Extremity Assessment: Generalized weakness        Vision       Perception     Praxis      Cognition Arousal/Alertness: Awake/alert Behavior During Therapy: WFL for tasks assessed/performed Overall Cognitive Status: Within Functional Limits for tasks assessed                                 General Comments:  Not very anxious today; just moving at his own pace.        Exercises      Shoulder Instructions       General Comments OT managing IV throughout session; OT secured drain with safety pin to gown prior to mobility. Pt able to walk from EOB on window side of the room to the door and back; approx 15 feet total; RW close SBA. No loss of balance. OT offered for pt to sit up in the chair at end of session, but pt stating that even with pillows the chair is very uncomfortable.    Pertinent Vitals/ Pain       Pain Assessment Pain Assessment: 0-10 Pain Score: 6  Pain Location: abdomen with mobility Pain Descriptors / Indicators: Discomfort, Sharp, Guarding, Grimacing Pain  Intervention(s): Limited activity within patient's tolerance, Monitored during session, Premedicated before session  Home Living                                          Prior Functioning/Environment              Frequency  Min 1X/week        Progress Toward Goals  OT Goals(current goals can now be found in the care plan section)  Progress towards OT goals: Progressing toward goals  Acute Rehab OT Goals Patient Stated Goal: Get better; go home OT Goal Formulation: With patient/family Time For Goal Achievement: 04/03/23 Potential to Achieve Goals: Good ADL Goals Pt Will Perform Grooming: with set-up;sitting;with modified independence Pt Will Perform Lower Body Dressing: with adaptive equipment;sit to/from stand;with supervision;with set-up Pt Will Transfer to Toilet: ambulating;bedside commode;with supervision Pt Will Perform Toileting - Clothing Manipulation and hygiene: with modified independence  Plan Discharge plan remains appropriate;Frequency remains appropriate    Co-evaluation                 AM-PAC OT "6 Clicks" Daily Activity     Outcome Measure   Help from another person eating meals?: None Help from another person taking care of personal grooming?: None Help from another person toileting, which includes using toliet, bedpan, or urinal?: A Little Help from another person bathing (including washing, rinsing, drying)?: A Lot Help from another person to put on and taking off regular upper body clothing?: A Little Help from another person to put on and taking off regular lower body clothing?: A Lot 6 Click Score: 18    End of Session Equipment Utilized During Treatment: Rolling walker (2 wheels)  OT Visit Diagnosis: Other abnormalities of gait and mobility (R26.89);Muscle weakness (generalized) (M62.81);Pain   Activity Tolerance Patient tolerated treatment well   Patient Left in bed;with call bell/phone within reach;with bed alarm  set   Nurse Communication Mobility status        Time: 1610-9604 OT Time Calculation (min): 26 min  Charges: OT General Charges $OT Visit: 1 Visit OT Treatments $Therapeutic Activity: 23-37 mins  Don Foster, MS, OTR/L  Don Arias 03/25/2023, 3:33 PM

## 2023-03-25 NOTE — Progress Notes (Signed)
PHARMACY - TOTAL PARENTERAL NUTRITION CONSULT NOTE   Indication: Prolonged ileus  Patient Measurements: Height: 5\' 10"  (177.8 cm) Weight: 83.1 kg (183 lb 3.2 oz) IBW/kg (Calculated) : 73 TPN AdjBW (KG): 81.6 Body mass index is 26.29 kg/m. Usual Weight:    Assessment:   Glucose / Insulin:  No apparent history of diabetes Ordered sensitive SSI q6h BG last 24h: 133-170 10u SSI required last 24h Electrolytes: WNL Renal: Scr < 1 Hepatic: Within normal limits  Intake / Output:  MIVF: NS at 60 cc/hr GI Imaging: 7/27:Free intra-abdominal air. Bowel perforation until proven otherwise.The exact etiology of the free air can not be determined on this examination. There is a mass lesion identified along the ascending colon with focal caliber change of the bowel worrisome for potential neoplasm.There also some abnormal nodes in the right hemi abdominal mesentery and possible liver metastasis. Further workup when appropriate. This could be the source of the free air but indeterminate. There is some fluid adjacent to the ascending colon in the pericolic gutter. GI Surgeries / Procedures:  Exploratory laparotomy on 7/28 and right hemicolectomy for right colon perforation likely from colonic obstruction, feculent peritonitis   Central access: PICC 7/31 TPN start date: 7/31  Nutritional Goals: Goal TPN rate is 85 mL/hr (provides 102 g of protein and 2060 kcals per day)  RD Assessment: Estimated Needs Total Energy Estimated Needs: 1900-2200kcal/day Total Protein Estimated Needs: 95-110g/day Total Fluid Estimated Needs: 1.9-2.2L/day  Current Nutrition:  CLD 8/2  Per surgery note 8/2: "Given continued ROBF, will proceed with NGT clamping trial. NGT clamped this morning. Check residuals at 1200. If residuals are <150 ccs, we can remove this and start diet"  Plan:  -Advance TPN to goal rate of 85 mL/hr at 1800 --Electrolytes in TPN: Na 63mEq/L, K 53mEq/L, Ca 58mEq/L, Mg 38mEq/L, and Phos  73mmol/L. Cl:Ac 1:1 --Add standard MVI and trace elements to TPN --Continue Sensitive q6h SSI and adjust as needed diet advanced to clear liquid 8/2 (monitor for need to add insulin to TPN bag once at full rate) --discontinue IVF --Thiamine 100 mg IV x 5 days --Monitor TPN labs on Mon/Thurs or as needed  , A 03/25/2023

## 2023-03-25 NOTE — Plan of Care (Signed)

## 2023-03-25 NOTE — Progress Notes (Signed)
CC: s/p right colectomy Subjective: Not much appetite Labs ok avss  Objective: Vital signs in last 24 hours: Temp:  [97.7 F (36.5 C)-98.7 F (37.1 C)] 97.7 F (36.5 C) (08/03 0813) Pulse Rate:  [71-82] 71 (08/03 0813) Resp:  [16-20] 16 (08/03 0813) BP: (136-170)/(74-84) 170/74 (08/03 0813) SpO2:  [92 %-95 %] 95 % (08/03 0813) Weight:  [83.1 kg] 83.1 kg (08/03 0524) Last BM Date : 03/16/23  Intake/Output from previous day: 08/02 0701 - 08/03 0700 In: 3409.6 [P.O.:780; I.V.:2002.8; IV Piggyback:626.8] Out: 1670 [Urine:1600; Drains:70] Intake/Output this shift: No intake/output data recorded.  Physical exam:  NAD alert Abd: still distended w decrease bs, drain serous No peritonitis, some thick drainage from midline wound , penrose + flatus but no BM  Lab Results: CBC  Recent Labs    03/23/23 0502 03/25/23 0743  WBC 7.3 8.1  HGB 10.6* 10.6*  HCT 31.8* 31.3*  PLT 231 280   BMET Recent Labs    03/24/23 0350 03/25/23 0743  NA 139 138  K 3.0* 3.8  CL 108 109  CO2 24 24  GLUCOSE 148* 165*  BUN 11 13  CREATININE 0.67 0.69  CALCIUM 8.3* 8.5*   PT/INR No results for input(s): "LABPROT", "INR" in the last 72 hours. ABG No results for input(s): "PHART", "HCO3" in the last 72 hours.  Invalid input(s): "PCO2", "PO2"  Studies/Results: No results found.  Anti-infectives: Anti-infectives (From admission, onward)    Start     Dose/Rate Route Frequency Ordered Stop   03/19/23 0600  piperacillin-tazobactam (ZOSYN) IVPB 3.375 g        3.375 g 12.5 mL/hr over 240 Minutes Intravenous Every 8 hours 03/18/23 1939 03/25/23 2159   03/18/23 1945  piperacillin-tazobactam (ZOSYN) IVPB 3.375 g  Status:  Discontinued        3.375 g 12.5 mL/hr over 240 Minutes Intravenous Every 8 hours 03/18/23 1937 03/18/23 1940   03/18/23 1845  piperacillin-tazobactam (ZOSYN) IVPB 3.375 g        3.375 g 100 mL/hr over 30 Minutes Intravenous  Once 03/18/23 1841 03/18/23 1936        Assessment/Plan:  Prolonged ileus slowly improving Not ready for regular diet Mobilize Still needs tpn  Sterling Big, MD, FACS  03/25/2023

## 2023-03-26 ENCOUNTER — Inpatient Hospital Stay: Payer: Medicare Other

## 2023-03-26 LAB — GLUCOSE, CAPILLARY
Glucose-Capillary: 188 mg/dL — ABNORMAL HIGH (ref 70–99)
Glucose-Capillary: 151 mg/dL — ABNORMAL HIGH (ref 70–99)
Glucose-Capillary: 183 mg/dL — ABNORMAL HIGH (ref 70–99)
Glucose-Capillary: 157 mg/dL — ABNORMAL HIGH (ref 70–99)

## 2023-03-26 MED ORDER — TRAVASOL 10 % IV SOLN
INTRAVENOUS | Status: AC
  Filled 2023-03-26: qty 1020

## 2023-03-26 NOTE — Progress Notes (Signed)
Mobility Specialist - Progress Note     03/26/23 1430  Mobility  Activity Ambulated with assistance in room;Stood at bedside  Level of Assistance Contact guard assist, steadying assist  Assistive Device Front wheel walker  Distance Ambulated (ft) 20 ft  Range of Motion/Exercises Active  Activity Response Tolerated well  Mobility Referral Yes  $Mobility charge 1 Mobility  Mobility Specialist Start Time (ACUTE ONLY) 1400  Mobility Specialist Stop Time (ACUTE ONLY) 1430  Mobility Specialist Time Calculation (min) (ACUTE ONLY) 30 min   Pt resting in bed on RA upon entry. Pt performs bed mobility indep with extra time due to fatigue. Pt STS and ambulates to recliner with RW CGA. Pt took 8 minute seated rest break before STS and ambulating to door. Pt returned to bed and left with needs in reach. Bed alarm activated.    [name] Mobility Specialist 03/26/23, 3:17 PM

## 2023-03-26 NOTE — TOC Progression Note (Signed)
Transition of Care [facility]) - Progression Note    Patient Details  Name: Don Arias MRN: [id number] Date of Birth: 20-Jun-1930  [facility]) CM/SW Contact  Susa Simmonds, [place] Phone Number: 03/26/2023, 2:51 PM  Clinical Narrative: CSW spoke with Tammy in admissions at [facility] who accepts patient. Patient is not showing up in the [employer] portal. Tammy will start insurance authorization tomorrow 8/5.     Expected Discharge Plan: Skilled Nursing Facility Barriers to Discharge: Continued Medical Work up  Expected Discharge Plan and Services     Post Acute Care Choice:  (TBD) Living arrangements for the past 2 months: Single Family Home                                       Social Determinants of Health (SDOH) Interventions SDOH Screenings   Food Insecurity: No Food Insecurity (03/19/2023)  Housing: Low Risk  (03/19/2023)  Transportation Needs: No Transportation Needs (03/19/2023)  Utilities: Not At Risk (03/19/2023)  Tobacco Use: Medium Risk (03/18/2023)    Readmission Risk Interventions     No data to display

## 2023-03-26 NOTE — Plan of Care (Signed)
  Problem: Skin Integrity: Goal: Risk for impaired skin integrity will decrease Outcome: Progressing   Problem: Safety: Goal: Ability to remain free from injury will improve Outcome: Progressing   Problem: Pain Managment: Goal: General experience of comfort will improve Outcome: Progressing   Problem: Elimination: Goal: Will not experience complications related to bowel motility Outcome: Progressing   Problem: Clinical Measurements: Goal: Ability to maintain clinical measurements within normal limits will improve Outcome: Progressing   Problem: Education: Goal: Knowledge of General Education information will improve Description: Including pain rating scale, medication(s)/side effects and non-pharmacologic comfort measures Outcome: Progressing

## 2023-03-26 NOTE — Plan of Care (Signed)

## 2023-03-26 NOTE — Progress Notes (Signed)
CC:  s/p right colectomy  Subjective: Doing about the same Had better night of sleep Some PO intake still not eating full meal Kub some scant free air, mild dialted loops Ambulated a bit  Objective: Vital signs in last 24 hours: Temp:  [98.2 F (36.8 C)-99.5 F (37.5 C)] 99.5 F (37.5 C) (08/04 0347) Pulse Rate:  [73-77] 77 (08/04 0347) Resp:  [16-22] 20 (08/04 0347) BP: (149-165)/(77-88) 153/83 (08/04 0347) SpO2:  [96 %-98 %] 97 % (08/04 0347) Weight:  [82 kg] 82 kg (08/04 0347) Last BM Date : 03/16/23  Intake/Output from previous day: 08/03 0701 - 08/04 0700 In: 1031.6 [I.V.:969.9; IV Piggyback:61.7] Out: 1180 [Urine:1000; Drains:180] Intake/Output this shift: No intake/output data recorded.  Physical exam:  NAD alert Abd: still distended w decrease bs, drain serous No peritonitis, some  drainage from midline wound , penrose   Lab Results: CBC  Recent Labs    03/25/23 0743  WBC 8.1  HGB 10.6*  HCT 31.3*  PLT 280   BMET Recent Labs    03/24/23 0350 03/25/23 0743  NA 139 138  K 3.0* 3.8  CL 108 109  CO2 24 24  GLUCOSE 148* 165*  BUN 11 13  CREATININE 0.67 0.69  CALCIUM 8.3* 8.5*   PT/INR No results for input(s): "LABPROT", "INR" in the last 72 hours. ABG No results for input(s): "PHART", "HCO3" in the last 72 hours.  Invalid input(s): "PCO2", "PO2"  Studies/Results: DG ABD ACUTE 2+V W 1V CHEST  Result Date: 03/26/2023 CLINICAL DATA:  Status post gastrointestinal surgery. Follow-up ileus. EXAM: DG ABDOMEN ACUTE WITH 1 VIEW CHEST COMPARISON:  CT from 03/18/2023. FINDINGS: Small amount of pneumoperitoneum noted within the right upper quadrant of the abdomen, decreased from previous exam. Likely postoperative. Blunting of the left costophrenic angle noted which may reflect a small effusion. Postsurgical change with drainage catheter overlying the lower abdomen and pelvis. No pathologic dilatation of the small bowel loops. Gaseous distension scratch set  there is mild gaseous distension noted involving the transverse colon, left colon and rectum. IMPRESSION: 1. Mild gaseous distension of the transverse colon, left colon and rectum. No pathologic dilatation of the small bowel loops. Imaging findings may reflect mild postoperative colonic ileus. 2. Small amount of pneumoperitoneum, decreased from previous exam. Likely postoperative. 3. Blunting of the left costophrenic angle which may reflect a small effusion. Electronically Signed   By: Signa Kell M.D.   On: 03/26/2023 10:04    Anti-infectives: Anti-infectives (From admission, onward)    Start     Dose/Rate Route Frequency Ordered Stop   03/19/23 0600  piperacillin-tazobactam (ZOSYN) IVPB 3.375 g        3.375 g 12.5 mL/hr over 240 Minutes Intravenous Every 8 hours 03/18/23 1939 03/25/23 1733   03/18/23 1945  piperacillin-tazobactam (ZOSYN) IVPB 3.375 g  Status:  Discontinued        3.375 g 12.5 mL/hr over 240 Minutes Intravenous Every 8 hours 03/18/23 1937 03/18/23 1940   03/18/23 1845  piperacillin-tazobactam (ZOSYN) IVPB 3.375 g        3.375 g 100 mL/hr over 30 Minutes Intravenous  Once 03/18/23 1841 03/18/23 1936       Assessment/Plan: Prolonged ileus slowly improving Wishes to stay fulls Mobilize Still needing tpn Consider repeating CT in am to make sure ileus is not caused by potential abscess   Sterling Big, MD, FACS  03/26/2023

## 2023-03-26 NOTE — Progress Notes (Signed)
PHARMACY - TOTAL PARENTERAL NUTRITION CONSULT NOTE   Indication: Prolonged ileus  Patient Measurements: Height: 5\' 10"  (177.8 cm) Weight: 82 kg (180 lb 12.4 oz) IBW/kg (Calculated) : 73 TPN AdjBW (KG): 81.6 Body mass index is 25.94 kg/m. Usual Weight:    Assessment:   Glucose / Insulin:  No apparent history of diabetes Ordered sensitive SSI q6h BG last 24h: 157-188 8 u SSI required last 24h Electrolytes: WNL Renal: Scr < 1 Hepatic: Within normal limits  Intake / Output:  MIVF: NS at 60 cc/hr GI Imaging: 7/27:Free intra-abdominal air. Bowel perforation until proven otherwise.The exact etiology of the free air can not be determined on this examination. There is a mass lesion identified along the ascending colon with focal caliber change of the bowel worrisome for potential neoplasm.There also some abnormal nodes in the right hemi abdominal mesentery and possible liver metastasis. Further workup when appropriate. This could be the source of the free air but indeterminate. There is some fluid adjacent to the ascending colon in the pericolic gutter. GI Surgeries / Procedures:  Exploratory laparotomy on 7/28 and right hemicolectomy for right colon perforation likely from colonic obstruction, feculent peritonitis   Central access: PICC 7/31 TPN start date: 7/31  Nutritional Goals: Goal TPN rate is 85 mL/hr (provides 102 g of protein and 2060 kcals per day)  RD Assessment: Estimated Needs Total Energy Estimated Needs: 1900-2200kcal/day Total Protein Estimated Needs: 95-110g/day Total Fluid Estimated Needs: 1.9-2.2L/day  Current Nutrition:  Full liquid 8/3  Per surgery note 8/2: "Given continued ROBF, will proceed with NGT clamping trial. NGT clamped this morning. Check residuals at 1200. If residuals are <150 ccs, we can remove this and start diet"  Plan:  -continue TPN at goal rate of 85 mL/hr at 1800 --Electrolytes in TPN: Na 20mEq/L, K 8mEq/L, Ca 38mEq/L, Mg 68mEq/L, and  Phos 49mmol/L. Cl:Ac 1:1 --Add standard MVI and trace elements to TPN --Continue Sensitive q6h SSI and adjust as needed diet advanced to full liquid 8/3 (monitor for need to add insulin to TPN bag once at full rate) --Thiamine 100 mg IV x 5 days --Monitor TPN labs on Mon/Thurs or as needed  , A 03/26/2023

## 2023-03-27 ENCOUNTER — Encounter: Payer: Self-pay | Admitting: Surgery

## 2023-03-27 ENCOUNTER — Inpatient Hospital Stay: Payer: Medicare Other

## 2023-03-27 LAB — CBC
HCT: 33.5 % — ABNORMAL LOW (ref 39.0–52.0)
Hemoglobin: 10.8 g/dL — ABNORMAL LOW (ref 13.0–17.0)
MCH: 29.3 pg (ref 26.0–34.0)
MCHC: 32.2 g/dL (ref 30.0–36.0)
MCV: 91 fL (ref 80.0–100.0)
Platelets: 285 10*3/uL (ref 150–400)
RBC: 3.68 MIL/uL — ABNORMAL LOW (ref 4.22–5.81)
RDW: 14.3 % (ref 11.5–15.5)
WBC: 13.3 10*3/uL — ABNORMAL HIGH (ref 4.0–10.5)
nRBC: 0 % (ref 0.0–0.2)

## 2023-03-27 LAB — COMPREHENSIVE METABOLIC PANEL
ALT: 23 U/L (ref 0–44)
AST: 20 U/L (ref 15–41)
Albumin: 1.9 g/dL — ABNORMAL LOW (ref 3.5–5.0)
Alkaline Phosphatase: 67 U/L (ref 38–126)
Anion gap: 6 (ref 5–15)
BUN: 36 mg/dL — ABNORMAL HIGH (ref 8–23)
CO2: 20 mmol/L — ABNORMAL LOW (ref 22–32)
Calcium: 8.7 mg/dL — ABNORMAL LOW (ref 8.9–10.3)
Chloride: 111 mmol/L (ref 98–111)
Creatinine, Ser: 1.04 mg/dL (ref 0.61–1.24)
GFR, Estimated: >60 mL/min (ref >60)
Glucose, Bld: 204 mg/dL — ABNORMAL HIGH (ref 70–99)
Potassium: 4.8 mmol/L (ref 3.5–5.1)
Sodium: 137 mmol/L (ref 135–145)
Total Bilirubin: 0.8 mg/dL (ref 0.3–1.2)
Total Protein: 4.4 g/dL — ABNORMAL LOW (ref 6.5–8.1)

## 2023-03-27 LAB — GLUCOSE, CAPILLARY
Glucose-Capillary: 178 mg/dL — ABNORMAL HIGH (ref 70–99)
Glucose-Capillary: 147 mg/dL — ABNORMAL HIGH (ref 70–99)
Glucose-Capillary: 117 mg/dL — ABNORMAL HIGH (ref 70–99)
Glucose-Capillary: 140 mg/dL — ABNORMAL HIGH (ref 70–99)

## 2023-03-27 LAB — TRIGLYCERIDES: Triglycerides: 142 mg/dL (ref <150)

## 2023-03-27 LAB — MAGNESIUM: Magnesium: 2.4 mg/dL (ref 1.7–2.4)

## 2023-03-27 LAB — PHOSPHORUS: Phosphorus: 3.6 mg/dL (ref 2.5–4.6)

## 2023-03-27 MED ORDER — IOHEXOL 300 MG/ML  SOLN
100.0000 mL | Freq: Once | INTRAMUSCULAR | Status: AC | PRN
  Administered 2023-03-27: 100 mL via INTRAVENOUS

## 2023-03-27 MED ORDER — LORAZEPAM 2 MG/ML IJ SOLN
0.5000 mg | Freq: Once | INTRAMUSCULAR | Status: AC | PRN
  Administered 2023-03-27: 0.5 mg via INTRAVENOUS
  Filled 2023-03-27: qty 1

## 2023-03-27 MED ORDER — PIPERACILLIN-TAZOBACTAM 3.375 G IVPB
3.3750 g | Freq: Three times a day (TID) | INTRAVENOUS | Status: DC
  Administered 2023-03-27 – 2023-04-03 (×20): 3.375 g via INTRAVENOUS
  Filled 2023-03-27 (×20): qty 50

## 2023-03-27 MED ORDER — TRAVASOL 10 % IV SOLN
INTRAVENOUS | Status: AC
  Filled 2023-03-27: qty 972

## 2023-03-27 MED ORDER — IOHEXOL 9 MG/ML PO SOLN: 500.0000 mL | Freq: Once | ORAL | Status: DC | PRN

## 2023-03-27 NOTE — Progress Notes (Signed)
[facility]ESS [facility]  Hospital Day(s): 9.   Post op day(s): 9 Days Post-Op.   Interval History:  Patient seen and examined No acute events or new complaints overnight.  Patient reports he is doing okay Still with incisional soreness No fever, chills, nausea Bump in leukocytosis this morning; 13.3K Hgb to 10.8 Renal function normal; sCr - 1.04; UO - 525 ccs No significant electrolyte derangements CEA level pending  Surgical drain with 160 ccs in last 24 hours; serosanguinous  Was previously advanced to FLD; now NPO Pending repeat CT Abdomen/Pelvis this AM  Vital signs in last 24 hours: [min-max] current  Temp:  [97.4 F (36.3 C)-98.9 F (37.2 C)] 97.4 F (36.3 C) (08/05 0444) Pulse Rate:  [64-74] 64 (08/05 0444) Resp:  [20] 20 (08/05 0444) BP: (111-149)/(61-76) 111/61 (08/05 0444) SpO2:  [95 %-97 %] 95 % (08/05 0444) Weight:  [85 kg] 85 kg (08/05 0444)     Height: 5\' 10"  (177.8 cm) Weight: 85 kg BMI (Calculated): 26.89   Intake/Output last 2 shifts:  08/04 0701 - 08/05 0700 In: 2270.2 [P.O.:440; I.V.:1830.2] Out: 685 [Urine:525; Drains:160]   Physical Exam:  Constitutional: alert, cooperative and no distress  Respiratory: breathing non-labored at rest  Cardiovascular: regular rate and sinus rhythm  Gastrointestinal: Soft, incisional soreness, non-distended, no rebound/guarding. Surgical drain in LLQ; output serosanguinous  Integumentary: Laparotomy is intact with staples, penrose in place, there is mild erythema, he continues to have seropurulent drainage from dependent portion  Labs:     Latest Ref Rng & Units 03/27/2023    6:04 AM 03/27/2023    4:15 AM 03/25/2023    7:43 AM  CBC  WBC 4.0 - 10.5 K/uL 13.3  SPECIMEN CONTAMINATED, UNABLE TO PERFORM TEST(S).  C 8.1   Hemoglobin 13.0 - 17.0 g/dL 21.3  SPECIMEN CONTAMINATED, UNABLE TO PERFORM TEST(S).  10.6   Hematocrit 39.0 - 52.0 % 33.5  SPECIMEN CONTAMINATED, UNABLE TO PERFORM TEST(S).   31.3   Platelets 150 - 400 K/uL 285  SPECIMEN CONTAMINATED, UNABLE TO PERFORM TEST(S).  C 280     C Corrected result      Latest Ref Rng & Units 03/27/2023    6:04 AM 03/25/2023    7:43 AM 03/24/2023    3:50 AM  CMP  Glucose 70 - 99 mg/dL 086  578  469   BUN 8 - 23 mg/dL 36  13  11   Creatinine 0.61 - 1.24 mg/dL 6.29  5.28  4.13   Sodium 135 - 145 mmol/L 137  138  139   Potassium 3.5 - 5.1 mmol/L 4.8  3.8  3.0   Chloride 98 - 111 mmol/L 111  109  108   CO2 22 - 32 mmol/L 20  24  24    Calcium 8.9 - 10.3 mg/dL 8.7  8.5  8.3   Total Protein 6.5 - 8.1 g/dL 4.4     Total Bilirubin 0.3 - 1.2 mg/dL 0.8     Alkaline Phos 38 - 126 U/L 67     AST 15 - 41 U/L 20     ALT 0 - 44 U/L 23        Imaging studies: No new pertinent imaging studies; CT pending    Assessment/Plan: 89 y.o. male with rising leukocytosis 9 Days Post-Op s/p exploratory laparotomy and right colectomy secondary to perforated right colon cancer    - Given rising leukocytosis, will plan for CT Abdomen/Pelvis to ensure no missed intra-abdominal process  -  NPO as a precaution pending CT results  - Continue TPN; at goal for now - Continue IV Abx (Zosyn) - Monitor abdominal examination; on-going bowel function              - Continue surgical drain; monitor and record output             - Pain control prn; antiemetics prn - Mobilize with therapies; doing well considering             - Pathology reviewed - pending CEA. Will likely involve oncology at some point this admission pending further work-up  All of the above findings and recommendations were discussed with the patient, and the medical team, and all of patient's questions were answered to his expressed satisfaction.  -- [name], PA-C [facility] 03/27/2023, 7:49 AM M-F: 7am - 4pm

## 2023-03-27 NOTE — Plan of Care (Signed)
  Problem: Clinical Measurements: Goal: Will remain free from infection Outcome: Progressing   Problem: Clinical Measurements: Goal: Diagnostic test results will improve Outcome: Progressing   Problem: Clinical Measurements: Goal: Cardiovascular complication will be avoided Outcome: Progressing   Problem: Pain Managment: Goal: General experience of comfort will improve Outcome: Progressing   Problem: Safety: Goal: Ability to remain free from injury will improve Outcome: Progressing

## 2023-03-27 NOTE — Progress Notes (Signed)
PHARMACY - TOTAL PARENTERAL NUTRITION CONSULT NOTE   Indication: Prolonged ileus  Patient Measurements: Height: 5\' 10"  (177.8 cm) Weight: 85 kg (187 lb 6.3 oz) IBW/kg (Calculated) : 73 TPN AdjBW (KG): 81.6 Body mass index is 26.89 kg/m. Usual Weight:    Assessment:   Glucose / Insulin:  No apparent history of diabetes Ordered sensitive SSI q6h BG last 24h: 151-204 8 u SSI required last 24h Electrolytes: WNL Renal: Scr < 1 Hepatic: Within normal limits  Intake / Output: +5L MIVF: stopped GI Imaging: 7/27:Free intra-abdominal air. Bowel perforation until proven otherwise.The exact etiology of the free air can not be determined on this examination. There is a mass lesion identified along the ascending colon with focal caliber change of the bowel worrisome for potential neoplasm.There also some abnormal nodes in the right hemi abdominal mesentery and possible liver metastasis. Further workup when appropriate. This could be the source of the free air but indeterminate. There is some fluid adjacent to the ascending colon in the pericolic gutter. GI Surgeries / Procedures:  Exploratory laparotomy on 7/28 and right hemicolectomy for right colon perforation likely from colonic obstruction, feculent peritonitis   Central access: PICC 7/31 TPN start date: 7/31  Nutritional Goals: Goal TPN rate is 75 mL/hr(reduced from 46ml/hr d/t 7 pound weight gain this admission) (provides 97 g of protein and 2030 kcals per day)  RD Assessment: Estimated Needs Total Energy Estimated Needs: 1900-2200kcal/day Total Protein Estimated Needs: 95-110g/day Total Fluid Estimated Needs: 1.9-2.2L/day  Current Nutrition:  Full liquid 8/3, NPO 8/6 at midnight(repeat CT planned 8/6, WBC rising)  Per surgery note 8/2: "Given continued ROBF, will proceed with NGT clamping trial. NGT clamped this morning. Check residuals at 1200. If residuals are <150 ccs, we can remove this and start diet"  Plan:  -continue  TPN at goal rate of 75 mL/hr at 1800 --Electrolytes in TPN: Na 16mEq/L, K 33mEq/L, Ca 42mEq/L, Mg 78mEq/L, and Phos 62mmol/L. Cl:Ac 1:1 --Add standard MVI and trace elements to TPN --Continue Sensitive q6h SSI and adjust as needed diet advanced to full liquid 8/3 (monitor for need to add insulin to TPN bag once at full rate) --Thiamine 100 mg IV x 5 days(completed) --Monitor TPN labs on Mon/Thurs or as needed  Bettey Costa 03/27/2023

## 2023-03-27 NOTE — Consult Note (Addendum)
Pharmacy Antibiotic Note  Don Arias is a 89 y.o. male admitted on 03/18/2023 with  intra-abdominal infection . PMH significant for GERD, HTN. Patient is s/p right colectomy secondary to perforated right colon cancer, now with worsening WBC. Patient completed a 5d course of Zosyn on 8/3. Pharmacy has been consulted for Zosyn dosing.  Plan: Restart Zoysn 3.375 g IV Q8H Continue to monitor renal function and follow culture results   Height: 5\' 10"  (177.8 cm) Weight: 85 kg (187 lb 6.3 oz) IBW/kg (Calculated) : 73  Temp (24hrs), Avg:97.9 F (36.6 C), Min:97.4 F (36.3 C), Max:98.9 F (37.2 C)  Recent Labs  Lab 03/22/23 0450 03/23/23 0502 03/24/23 0350 03/25/23 0743 03/27/23 0415 03/27/23 0604  WBC 10.2 7.3  --  8.1 SPECIMEN CONTAMINATED, UNABLE TO PERFORM TEST(S). 13.3*  CREATININE 0.98 0.73 0.67 0.69  --  1.04    Estimated Creatinine Clearance: 48.7 mL/min (by C-G formula based on SCr of 1.04 mg/dL).    Allergies  Allergen Reactions   Nitroglycerin Other (See Comments)    Cardiac Arrest   Gabapentin     Other Reaction(s): Coordination problem   Hydralazine Hcl     Other Reaction(s): Unknown   Lisinopril Cough    Other reaction(s): Cough   Pregabalin     Other Reaction(s): Dizziness  Other reaction(s): Dizziness   Tramadol     Other Reaction(s): Other (See Comments), Unknown   Valsartan     Other Reaction(s): Cough  Other reaction(s): Cough   Verapamil     Other Reaction(s): Dizziness  Other reaction(s): Dizziness   Guaifenesin Rash   Sulfa Antibiotics Rash    Antimicrobials this admission: 7/28 Zosyn >> 8/3 8/5 Zosyn >>   Dose adjustments this admission: N/A  Microbiology results: N/A  Thank you for allowing pharmacy to be a part of this patient's care.  Celene Squibb, PharmD Clinical Pharmacist 03/27/2023 6:58 PM

## 2023-03-27 NOTE — Plan of Care (Signed)

## 2023-03-27 NOTE — Progress Notes (Signed)
OT Cancellation Note  Patient Details Name: Don Arias MRN: [id number] DOB: [birth year]   Cancelled Treatment:    Reason Eval/Treat Not Completed: Patient at procedure or test/ unavailable.  Patient out of room for CT, will follow up as pt available.    Kathie Dike, M.S. OTR/L  03/27/23, 3:13 PM  ascom [phone]

## 2023-03-27 NOTE — Progress Notes (Signed)
PT Cancellation Note  Patient Details Name: LAYTHEN MCKINNY MRN: [id number] DOB: [birth year]   Cancelled Treatment:    Reason Eval/Treat Not Completed: Patient at procedure or test/unavailable (Spoke with patient and daughter. Patient is about to go for CT scan and requesting to hold on PT at this time. PT will continue with attempts as appropriate)  Donna Bernard, PT, MPT  Ina Homes 03/27/2023, 1:46 PM

## 2023-03-27 NOTE — Care Management Important Message (Signed)
Important Message  Patient Details  Name: Don Arias MRN: [id number] Date of Birth: [birth year]   Medicare Important Message Given:  Yes     [name] 03/27/2023, 10:55 AM

## 2023-03-28 LAB — GLUCOSE, CAPILLARY
Glucose-Capillary: 171 mg/dL — ABNORMAL HIGH (ref 70–99)
Glucose-Capillary: 127 mg/dL — ABNORMAL HIGH (ref 70–99)
Glucose-Capillary: 141 mg/dL — ABNORMAL HIGH (ref 70–99)
Glucose-Capillary: 158 mg/dL — ABNORMAL HIGH (ref 70–99)

## 2023-03-28 MED ORDER — ENSURE ENLIVE PO LIQD
237.0000 mL | Freq: Three times a day (TID) | ORAL | Status: DC
  Administered 2023-03-28 – 2023-04-04 (×7): 237 mL via ORAL

## 2023-03-28 MED ORDER — TRAVASOL 10 % IV SOLN
INTRAVENOUS | Status: AC
  Filled 2023-03-28: qty 972

## 2023-03-28 MED ORDER — ADULT MULTIVITAMIN W/MINERALS CH
1.0000 | ORAL_TABLET | Freq: Every day | ORAL | Status: DC
  Administered 2023-03-29 – 2023-04-04 (×7): 1 via ORAL
  Filled 2023-03-28 (×7): qty 1

## 2023-03-28 NOTE — Progress Notes (Signed)
PHARMACY - TOTAL PARENTERAL NUTRITION CONSULT NOTE   Indication: Prolonged ileus  Patient Measurements: Height: 5\' 10"  (177.8 cm) Weight: 87.3 kg (192 lb 7.4 oz) IBW/kg (Calculated) : 73 TPN AdjBW (KG): 81.6 Body mass index is 27.62 kg/m. Usual Weight:    Assessment:   Glucose / Insulin:  No apparent history of diabetes Ordered sensitive SSI q6h BG last 24h: 140-170 4 u SSI required last 24h Electrolytes: WNL Renal: Scr < 1 Hepatic: Within normal limits  Intake / Output: +7L MIVF: stopped GI Imaging: 7/27:Free intra-abdominal air. Bowel perforation until proven otherwise.The exact etiology of the free air can not be determined on this examination. There is a mass lesion identified along the ascending colon with focal caliber change of the bowel worrisome for potential neoplasm.There also some abnormal nodes in the right hemi abdominal mesentery and possible liver metastasis. Further workup when appropriate. This could be the source of the free air but indeterminate. There is some fluid adjacent to the ascending colon in the pericolic gutter. GI Surgeries / Procedures:  Exploratory laparotomy on 7/28 and right hemicolectomy for right colon perforation likely from colonic obstruction, feculent peritonitis   Central access: PICC 7/31 TPN start date: 7/31  Nutritional Goals: Goal TPN rate is 75 mL/hr(reduced from 50ml/hr d/t 7 pound weight gain this admission) (provides 97 g of protein and 2030 kcals per day)  RD Assessment: Estimated Needs Total Energy Estimated Needs: 1900-2200kcal/day Total Protein Estimated Needs: 95-110g/day Total Fluid Estimated Needs: 1.9-2.2L/day  Current Nutrition:  Full liquid 8/3  Per surgery note 8/2: "Given continued ROBF, will proceed with NGT clamping trial. NGT clamped this morning. Check residuals at 1200. If residuals are <150 ccs, we can remove this and start diet"  Plan:  -continue TPN at goal rate of 75 mL/hr at 1800 --Electrolytes  in TPN: Na 56mEq/L, K 42mEq/L, Ca 51mEq/L, Mg 72mEq/L, and Phos 59mmol/L. Cl:Ac 1:1 --Add standard MVI and trace elements to TPN --Continue Sensitive q6h SSI and adjust as needed Advanced to soft diet 8/6 (monitor for need to add insulin to TPN bag once at full rate) --Thiamine 100 mg IV x 5 days(completed) --Monitor TPN labs on Mon/Thurs or as needed  Bettey Costa, PharmD Clinical Pharmacist 03/28/2023 9:38 AM

## 2023-03-28 NOTE — Progress Notes (Signed)
Occupational Therapy Treatment Patient Details Name: Don Arias MRN: [id number] DOB: Jun 01, 1930 Today's Date: 03/28/2023   History of present illness Pt is a 89 yo M status post exploratory laparotomy and right hemicolectomy for right colon perforation likely from colonic obstruction.  PMH includes skin CA, HTN, and anxiety.   OT comments  Pt seen for OT treatment on this date. Upon arrival to room, NT assisting pt with repositioning in bed. Pt assisted with slight rolling side to side, and boosting himself up in bed with minA. Pt declines OOB mobility at this time, seated EOB, agreeable to tx activity focusing on functional movements BLE/BUE for ADL and mobility. Therapeutic activity completed as seen in flowsheet. Pt reported constant urgency to void, RN notified of this. Pt left repositioned in bed, family present, needs within reach, alarm activated. Discharge recommendation remains appropriate.  OT will continue to follow acutely.   Recommendations for follow up therapy are one component of a multi-disciplinary discharge planning process, led by the attending physician.  Recommendations may be updated based on patient status, additional functional criteria and insurance authorization.    Assistance Recommended at Discharge Frequent or constant Supervision/Assistance  Patient can return home with the following  A lot of help with bathing/dressing/bathroom;Assist for transportation;Assistance with cooking/housework;Help with stairs or ramp for entrance;A little help with walking and/or transfers   Equipment Recommendations  Other (comment) (RW)       Precautions / Restrictions Precautions Precautions: Fall Precaution Comments: JP drain       Mobility Bed Mobility Overal bed mobility: Needs Assistance Bed Mobility: Rolling Rolling: Min assist         General bed mobility comments: Assisted pt with slight roll both sides for comfort and repositioning, pt able to boost self up  towards [facility] with minA    Transfers                   General transfer comment: Did not transfer pt at this time     Balance                                           ADL either performed or assessed with clinical judgement   ADL                                         General ADL Comments: Did not perform ADLs - pt declining participation d/t fatigue/pain.      Cognition Arousal/Alertness: Awake/alert Behavior During Therapy: WFL for tasks assessed/performed Overall Cognitive Status: Within Functional Limits for tasks assessed                                          Exercises Exercises: General Upper Extremity, General Lower Extremity General Exercises - Upper Extremity Shoulder Flexion: AROM, 10 reps, Both Elbow Flexion: 10 reps, Both, AROM Elbow Extension: 10 reps, Both, AROM General Exercises - Lower Extremity Ankle Circles/Pumps: 5 reps, Both, AROM            Pertinent Vitals/ Pain       Pain Assessment Pain Assessment: 0-10 Pain Score: 7  Pain Location: abdomen with mobility Pain Descriptors / Indicators: Discomfort, Sharp, Guarding,  Grimacing Pain Intervention(s): Limited activity within patient's tolerance, Patient requesting pain meds-RN notified   Frequency  Min 1X/week        Progress Toward Goals  OT Goals(current goals can now be found in the care plan section)  Progress towards OT goals: Progressing toward goals  Acute Rehab OT Goals Time For Goal Achievement: 04/03/23 Potential to Achieve Goals: Good  Plan Discharge plan remains appropriate;Frequency remains appropriate       AM-PAC OT "6 Clicks" Daily Activity     Outcome Measure   Help from another person eating meals?: None Help from another person taking care of personal grooming?: None Help from another person toileting, which includes using toliet, bedpan, or urinal?: A Little Help from another person bathing  (including washing, rinsing, drying)?: A Lot Help from another person to put on and taking off regular upper body clothing?: A Little Help from another person to put on and taking off regular lower body clothing?: A Lot 6 Click Score: 18    End of Session    OT Visit Diagnosis: Other abnormalities of gait and mobility (R26.89);Muscle weakness (generalized) (M62.81);Pain   Activity Tolerance Patient limited by fatigue;Patient limited by pain   Patient Left in bed;with call bell/phone within reach;with family/visitor present   Nurse Communication Mobility status (1 oz urine void, pain level, drain)        Time: 1350-1404 OT Time Calculation (min): 14 min  Charges: OT General Charges $OT Visit: 1 Visit OT Treatments $Therapeutic Activity: 8-22 mins   L. , OTR/L  03/28/23, 2:19 PM

## 2023-03-28 NOTE — Progress Notes (Signed)
Nutrition Follow Up Note   DOCUMENTATION CODES:   Not applicable  INTERVENTION:   Continue TPN per pharmacy - provides 2030kcal/day and 97g/day protein.   Ensure Enlive po TID, each supplement provides 350 kcal and 20 grams of protein.  MVI po daily   Daily weights   NUTRITION DIAGNOSIS:   Inadequate oral intake related to altered GI function as evidenced by NPO status. -resolving   GOAL:   Patient will meet greater than or equal to 90% of their needs -met with TPN  MONITOR:   PO intake, Supplement acceptance, Labs, Weight trends, I & O's, Skin  ASSESSMENT:   89 y/o male with h/o GERD, PUD (gastric and duodenal ulcers), HTN, BPH and anxiety who is admitted with right colon mass/obstruction with perforation now s/p exploratory laparotomy with right colectomy with ileocolonic anastomosis 7/28 (with resection of 14.5cm of terminal ileum) complicated by post op ileus. Pathology returned as invasive adenocarcinoma.   Pt tolerating TPN well at goal rate. Refeed labs stable. Pt tolerating a full liquid diet without issue but his appetite remains poor. Pt advanced to a soft diet today. RD will add Ensure supplements. Pt is having bowel function. Recommend continue TPN until pt's oral intake improves. Per chart, pt is up ~8lbs since admission; will monitor.   Medications reviewed and include: lovenox, insulin, protonix, zosyn  Labs reviewed: Na 132(L), BUN 38(H), K 4.7 wnl P 3.6 wnl, Mg 2.4 wnl- 8/5 Triglycerides- 142- 8/5 Wbc- 11.1(H), Hgb 10.0(L), Hct 31.3(L)  Drains-   Diet Order:   Diet Order             DIET SOFT Fluid consistency: Thin  Diet effective now                  EDUCATION NEEDS:   Education needs have been addressed  Skin:  Skin Assessment: Reviewed RN Assessment (incision abdomen)  Last BM:  8/5  Height:   Ht Readings from Last 1 Encounters:  03/18/23 5\' 10"  (1.778 m)    Weight:   Wt Readings from Last 1 Encounters:  03/28/23  87.3 kg    Ideal Body Weight:  75.45 kg  BMI:  Body mass index is 27.62 kg/m.  Estimated Nutritional Needs:   Kcal:  1900-2200kcal/day  Protein:  95-110g/day  Fluid:  1.9-2.2L/day  Betsey Holiday MS, RD, LDN Please refer to [facility] for RD and/or RD on-call/weekend/after hours pager

## 2023-03-28 NOTE — Progress Notes (Signed)
Physical Therapy Treatment Patient Details Name: Don Arias MRN: [id number] DOB: Feb 15, 1930 Today's Date: 03/28/2023   History of Present Illness Pt is a 89 yo M status post exploratory laparotomy and right hemicolectomy for right colon perforation likely from colonic obstruction.  PMH includes skin CA, HTN, and anxiety.    PT Comments  Pt was pleasant and willing to participate during the session and put forth good effort throughout, although session ultimately limited by pain despite being premedicated. Initial session readings of 99% SpO2 and HR 76 bpm. Pt able to get seated on EOB with increased time, though supervision assist from PT. Able to stand at [facility] with CGA and minor cues for foot placement. Pt ambulated towards door with CGA with RW, requiring consistent cues to continue with ambulation. Pt having to stop every 1-2 steps to catch breath and adjust posture. Final session readings of 92% SpO2 and HR of 85 bpm. Pt will benefit from continued PT services upon discharge to safely address deficits listed in patient problem list for decreased caregiver assistance and eventual return to PLOF.    If plan is discharge home, recommend the following: A lot of help with bathing/dressing/bathroom;Assistance with cooking/housework;Direct supervision/assist for medications management;Help with stairs or ramp for entrance;Assist for transportation;A little help with walking and/or transfers   Can travel by private vehicle     No  Equipment Recommendations   (Defer DME to next venue)    Recommendations for Other Services       Precautions / Restrictions Precautions Precautions: Fall Precaution Comments: JP drain Restrictions Weight Bearing Restrictions: No Other Position/Activity Restrictions: Pt declined use on abd binder on 03/28/23     Mobility  Bed Mobility Overal bed mobility: Needs Assistance Bed Mobility: Supine to Sit, Sit to Supine     Supine to sit: Supervision, HOB  elevated Sit to supine: Supervision Sit to sidelying: Min assist General bed mobility comments: Pt requested to preform at own pace, slow but only needs assist with LE's for sit to supine    Transfers Overall transfer level: Needs assistance Equipment used: Rolling walker (2 wheels) Transfers: Sit to/from Stand Sit to Stand: Min guard           General transfer comment: Pt transfer at CGA, slow but able, pain limiting.    Ambulation/Gait Ambulation/Gait assistance: Min guard Gait Distance (Feet): 15 Feet Assistive device: Rolling walker (2 wheels) Gait Pattern/deviations: Step-through pattern, Decreased step length - right, Decreased step length - left, Decreased stride length, Antalgic Gait velocity: decreased     General Gait Details: pt able to amb towards door and back to far side of bed, slow pace, stopping every 1-2 steps to straighten up. Needed consistent cueing to keep moving and for directions   Stairs             Wheelchair Mobility     Tilt Bed    Modified Rankin (Stroke Patients Only)       Balance Overall balance assessment: Needs assistance Sitting-balance support: Feet supported, Bilateral upper extremity supported Sitting balance-Leahy Scale: Fair Sitting balance - Comments: req use of BUE for support to manage pain level with readjustments   Standing balance support: Bilateral upper extremity supported, During functional activity, Reliant on assistive device for balance Standing balance-Leahy Scale: Fair Standing balance comment: needs BUE on AD to safely amb  Cognition Arousal/Alertness: Awake/alert Behavior During Therapy: WFL for tasks assessed/performed Overall Cognitive Status: Within Functional Limits for tasks assessed                                          Exercises Other Exercises Other Exercises: education on importance of mobilty progression, pressure releif while  in bed. Other Exercises: educated on benefits of sitting up, pt sat EOB but declined recliner d/t pain when seated there    General Comments General comments (skin integrity, edema, etc.): JP drain safely pinned to gown prior to amb.      Pertinent Vitals/Pain Pain Assessment Pain Assessment: Faces Faces Pain Scale: Hurts little more Pain Location: abdomen with mobility, intermitten sensitiveity to lower back Pain Descriptors / Indicators: Discomfort, Sharp, Guarding, Grimacing Pain Intervention(s): Monitored during session, Premedicated before session, Limited activity within patient's tolerance    Home Living                          Prior Function            PT Goals (current goals can now be found in the care plan section) Progress towards PT goals: Progressing toward goals    Frequency    Min 1X/week      PT Plan Current plan remains appropriate    Co-evaluation              AM-PAC PT "6 Clicks" Mobility   Outcome Measure  Help needed turning from your back to your side while in a flat bed without using bedrails?: A Little Help needed moving from lying on your back to sitting on the side of a flat bed without using bedrails?: A Lot Help needed moving to and from a bed to a chair (including a wheelchair)?: A Lot Help needed standing up from a chair using your arms (e.g., wheelchair or bedside chair)?: A Little Help needed to walk in hospital room?: A Little Help needed climbing 3-5 steps with a railing? : A Lot 6 Click Score: 15    End of Session Equipment Utilized During Treatment: Gait belt Activity Tolerance: Patient limited by pain;Patient tolerated treatment well Patient left: in bed;with call bell/phone within reach;with bed alarm set Nurse Communication: Mobility status PT Visit Diagnosis: Other abnormalities of gait and mobility (R26.89);Muscle weakness (generalized) (M62.81);Difficulty in walking, not elsewhere classified  (R26.2);Pain Pain - part of body:  (Abdomen)     Time: 1610-9604 PT Time Calculation (min) (ACUTE ONLY): 46 min  Charges:                            Cecile Sheerer, SPT 03/28/23, 5:27 PM

## 2023-03-28 NOTE — Progress Notes (Signed)
[facility] SURGICAL PROGRESS NOTE  Hospital Day(s): 10.   Post op day(s): 10 Days Post-Op.   Interval History:  Patient seen and examined No acute events or new complaints overnight.  Patient reports doing better this AM No fever, chills, nausea, emesis Leukocytosis improved this morning; 11.1K Hgb to 10.0 Renal function normal; sCr - 1.03; UO - 1150 ccs No significant electrolyte derangements CEA level normal at 1.4 Surgical drain with 380 ccs in last 24 hours; serosanguinous  Advanced to FLD; tolerating Having bowel function  Vital signs in last 24 hours: [min-max] current  Temp:  [97.4 F (36.3 C)-98.2 F (36.8 C)] 98.2 F (36.8 C) (08/06 0342) Pulse Rate:  [72-83] 73 (08/06 0342) Resp:  [16-18] 18 (08/06 0342) BP: (123-135)/(61-75) 131/61 (08/06 0342) SpO2:  [92 %-98 %] 96 % (08/06 0342) Weight:  [87.3 kg] 87.3 kg (08/06 0340)     Height: 5\' 10"  (177.8 cm) Weight: 87.3 kg BMI (Calculated): 27.62   Intake/Output last 2 shifts:  08/05 0701 - 08/06 0700 In: 3293.3 [P.O.:360; I.V.:2933.3] Out: 1530 [Urine:1150; Drains:380]   Physical Exam:  Constitutional: alert, cooperative and no distress  Respiratory: breathing non-labored at rest  Cardiovascular: regular rate and sinus rhythm  Gastrointestinal: Soft, incisional soreness, non-distended, no rebound/guarding. Surgical drain in LLQ; output serosanguinous  Integumentary: Laparotomy is intact with staples, penrose in place, there is mild erythema, drainage from inferior aspect more serosanguinous   Labs:     Latest Ref Rng & Units 03/28/2023    5:09 AM 03/27/2023    6:04 AM 03/27/2023    4:15 AM  CBC  WBC 4.0 - 10.5 K/uL 11.1  13.3  SPECIMEN CONTAMINATED, UNABLE TO PERFORM TEST(S).  C  Hemoglobin 13.0 - 17.0 g/dL 35.0  09.3  SPECIMEN CONTAMINATED, UNABLE TO PERFORM TEST(S).   Hematocrit 39.0 - 52.0 % 31.3  33.5  SPECIMEN CONTAMINATED, UNABLE TO PERFORM TEST(S).   Platelets 150 - 400 K/uL 273  285   SPECIMEN CONTAMINATED, UNABLE TO PERFORM TEST(S).  C    C Corrected result      Latest Ref Rng & Units 03/28/2023    5:09 AM 03/27/2023    6:04 AM 03/25/2023    7:43 AM  CMP  Glucose 70 - 99 mg/dL 818  299  371   BUN 8 - 23 mg/dL 38  36  13   Creatinine 0.61 - 1.24 mg/dL 6.96  7.89  3.81   Sodium 135 - 145 mmol/L 132  137  138   Potassium 3.5 - 5.1 mmol/L 4.7  4.8  3.8   Chloride 98 - 111 mmol/L 106  111  109   CO2 22 - 32 mmol/L 22  20  24    Calcium 8.9 - 10.3 mg/dL 8.7  8.7  8.5   Total Protein 6.5 - 8.1 g/dL  4.4    Total Bilirubin 0.3 - 1.2 mg/dL  0.8    Alkaline Phos 38 - 126 U/L  67    AST 15 - 41 U/L  20    ALT 0 - 44 U/L  23       Imaging studies:  CT Abdomen/Pelvis (03/27/2023) personally reviewed without abscess, no free air, anastomosis patent with obstruction, and radiologist report reviewed below: IMPRESSION: Interval right hemicolectomy with primary anastomosis. Expected surgical changes with some stranding, ill-defined areas of mild fluid and air. No clear separate rim enhancing fluid collections identified at this time. No bowel obstruction. Drains in place.   Sigmoid colon diverticulosis  with slight stranding and wall thickening. In principle a subtle simple diverticulitis is not excluded. No complex features. Please correlate with clinical presentation.   Tiny pleural effusions, left-greater-than-right with some adjacent opacity.   Persistent dome liver lesion. Again a metastatic lesion is in the differential. Additional workup when clinically appropriate.   4.2 cm infrarenal abdominal aortic aneurysm. Recommend continued follow up surveillance. Recommend follow-up every 12 months and vascular consultation. This recommendation follows ACR consensus guidelines: White Paper of the ACR Incidental Findings Committee II on Vascular Findings. J Am Coll Radiol 2013; 10:789-794.    Assessment/Plan: 89 y.o. male with rising leukocytosis 10 Days Post-Op s/p  exploratory laparotomy and right colectomy secondary to perforated right colon cancer    - Will advance to soft diet  - -Continue TPN; at goal for now - hopefully start to wean in next 24 hours - Continue IV Abx (Zosyn) - Monitor abdominal examination; on-going bowel function              - Continue surgical drain; monitor and record output             - Pain control prn; antiemetics prn - Mobilize with therapies; doing well considering             - Pathology reviewed - CEA normal. Will likely involve oncology at some point this admission pending further work-up (CT Chest, MRI)  All of the above findings and recommendations were discussed with the patient, and the medical team, and all of patient's questions were answered to his expressed satisfaction.  -- [name], PA-C [facility] 03/28/2023, 7:17 AM M-F: 7am - 4pm

## 2023-03-29 LAB — GLUCOSE, CAPILLARY
Glucose-Capillary: 142 mg/dL — ABNORMAL HIGH (ref 70–99)
Glucose-Capillary: 140 mg/dL — ABNORMAL HIGH (ref 70–99)
Glucose-Capillary: 144 mg/dL — ABNORMAL HIGH (ref 70–99)
Glucose-Capillary: 130 mg/dL — ABNORMAL HIGH (ref 70–99)
Glucose-Capillary: 153 mg/dL — ABNORMAL HIGH (ref 70–99)

## 2023-03-29 MED ORDER — TRAVASOL 10 % IV SOLN
INTRAVENOUS | Status: AC
  Filled 2023-03-29: qty 972

## 2023-03-29 NOTE — Progress Notes (Signed)
Occupational Therapy Treatment Patient Details Name: Don Arias MRN: [id number] DOB: 1930-05-09 Today's Date: 03/29/2023   History of present illness Pt is a 89 yo M status post exploratory laparotomy and right hemicolectomy for right colon perforation likely from colonic obstruction.  PMH includes skin CA, HTN, and anxiety.   OT comments  Don Arias was seen for OT treatment on this date. Upon arrival to room pt reclined in bed, noted to have wet bed, agreeable to tx. Pt requires SETUP for urinal use at bed level. CGA sup<>sit and sit<>stand, increased time 2/2 pain. MAX A pericare in standing, MOD A don/doff gown in standing. Tolerates ~3 feet steps along EOB. Pt making good progress toward goals, will continue to follow POC. Discharge recommendation remains appropriate.        If plan is discharge home, recommend the following:  A lot of help with bathing/dressing/bathroom;Assist for transportation;Assistance with cooking/housework;Help with stairs or ramp for entrance;A little help with walking and/or transfers   Equipment Recommendations  Other (comment) (defer)    Recommendations for Other Services      Precautions / Restrictions Precautions Precautions: Fall Precaution Comments: JP drain Restrictions Weight Bearing Restrictions: No Other Position/Activity Restrictions: Pt declined use on abd binder on 03/28/23       Mobility Bed Mobility Overal bed mobility: Needs Assistance Bed Mobility: Supine to Sit, Sit to Supine     Supine to sit: Supervision, HOB elevated Sit to supine: Supervision   General bed mobility comments: increased time    Transfers Overall transfer level: Needs assistance Equipment used: Rolling walker (2 wheels) Transfers: Sit to/from Stand Sit to Stand: Contact guard assist                 Balance Overall balance assessment: Needs assistance Sitting-balance support: Feet supported, Bilateral upper extremity supported Sitting  balance-Leahy Scale: Fair     Standing balance support: During functional activity, Single extremity supported Standing balance-Leahy Scale: Fair                             ADL either performed or assessed with clinical judgement   ADL Overall ADL's : Needs assistance/impaired                                       General ADL Comments: MAX A pericare in standing. SETUP for urinal use at bed level. MOD A don gown in standing      Cognition Arousal: Alert Behavior During Therapy: WFL for tasks assessed/performed Overall Cognitive Status: Within Functional Limits for tasks assessed                                                     Pertinent Vitals/ Pain       Pain Assessment Pain Assessment: Faces Faces Pain Scale: Hurts little more Pain Location: abdomen with mobility, intermitten sensitiveity to lower back Pain Descriptors / Indicators: Discomfort, Sharp, Guarding, Grimacing Pain Intervention(s): Limited activity within patient's tolerance, Repositioned   Frequency  Min 1X/week        Progress Toward Goals  OT Goals(current goals can now be found in the care plan section)  Progress towards OT goals: Progressing toward goals  Acute  Rehab OT Goals Patient Stated Goal: improve pain OT Goal Formulation: With patient/family Time For Goal Achievement: 04/03/23 Potential to Achieve Goals: Good ADL Goals Pt Will Perform Grooming: with set-up;sitting;with modified independence Pt Will Perform Lower Body Dressing: with adaptive equipment;sit to/from stand;with supervision;with set-up Pt Will Transfer to Toilet: ambulating;bedside commode;with supervision Pt Will Perform Toileting - Clothing Manipulation and hygiene: with modified independence  Plan Discharge plan remains appropriate;Frequency remains appropriate    Co-evaluation                 AM-PAC OT "6 Clicks" Daily Activity     Outcome Measure   Help  from another person eating meals?: None Help from another person taking care of personal grooming?: A Little Help from another person toileting, which includes using toliet, bedpan, or urinal?: A Little Help from another person bathing (including washing, rinsing, drying)?: A Lot Help from another person to put on and taking off regular upper body clothing?: A Little Help from another person to put on and taking off regular lower body clothing?: A Lot 6 Click Score: 17    End of Session Equipment Utilized During Treatment: Rolling walker (2 wheels)  OT Visit Diagnosis: Other abnormalities of gait and mobility (R26.89);Muscle weakness (generalized) (M62.81)   Activity Tolerance Patient tolerated treatment well   Patient Left in bed;with call bell/phone within reach;with bed alarm set   Nurse Communication          Time: 7829-5621 OT Time Calculation (min): 26 min  Charges: OT General Charges $OT Visit: 1 Visit OT Treatments $Self Care/Home Management : 23-37 mins  Kathie Dike, M.S. OTR/L  03/29/23, 3:20 PM  ascom [phone]

## 2023-03-29 NOTE — Progress Notes (Signed)
PHARMACY - TOTAL PARENTERAL NUTRITION CONSULT NOTE   Indication: Prolonged ileus  Patient Measurements: Height: 5\' 10"  (177.8 cm) Weight: 87.4 kg (192 lb 10.9 oz) IBW/kg (Calculated) : 73 TPN AdjBW (KG): 81.6 Body mass index is 27.65 kg/m. Usual Weight:  82 kg on 03/26/2023  Assessment:   Glucose / Insulin:  No apparent history of diabetes Ordered sensitive SSI q6h BG last 24h: 141-158 4u SSI required last 24h Electrolytes: WNL Renal: Scr < 1 Hepatic: Within normal limits  Intake / Output: +6L MIVF: stopped GI Imaging: 7/27:Free intra-abdominal air. Bowel perforation until proven otherwise.The exact etiology of the free air can not be determined on this examination. There is a mass lesion identified along the ascending colon with focal caliber change of the bowel worrisome for potential neoplasm.There also some abnormal nodes in the right hemi abdominal mesentery and possible liver metastasis. Further workup when appropriate. This could be the source of the free air but indeterminate. There is some fluid adjacent to the ascending colon in the pericolic gutter. GI Surgeries / Procedures:  Exploratory laparotomy on 7/28 and right hemicolectomy for right colon perforation likely from colonic obstruction, feculent peritonitis   Central access: PICC 7/31 TPN start date: 7/31  Nutritional Goals: Goal TPN rate is 75 mL/hr(reduced from 40ml/hr d/t 12 pound weight gain this admission) (provides 97 g of protein and 2030 kcals per day)  RD Assessment: Estimated Needs Total Energy Estimated Needs: 1900-2200kcal/day Total Protein Estimated Needs: 95-110g/day Total Fluid Estimated Needs: 1.9-2.2L/day  Current Nutrition:  Soft diet 8/6  Per surgery note 8/2: "Given continued ROBF, will proceed with NGT clamping trial. NGT clamped this morning. Check residuals at 1200. If residuals are <150 ccs, we can remove this and start diet"  Plan:  -continue TPN at goal rate of 75 mL/hr at  1800 --Electrolytes in TPN: Na 41mEq/L, K 1mEq/L, Ca 28mEq/L, Mg 5mEq/L, and Phos 55mmol/L. Cl:Ac 1:1 --Continue lipids in bag: 54 g --Add standard MVI and trace elements to TPN --Continue Sensitive q6h SSI and adjust as needed Advanced to soft diet 8/6 (monitor for need to add insulin to TPN bag once at full rate). Holding off on taper of TPN until patient is able to tolerate. --Thiamine 100 mg IV x 5 days(completed) --Monitor TPN labs on Mon/Thurs or as needed  Bettey Costa, PharmD Clinical Pharmacist 03/29/2023 9:17 AM

## 2023-03-29 NOTE — Progress Notes (Signed)
[facility] SURGICAL PROGRESS NOTE  Hospital Day(s): 11.   Post op day(s): 11 Days Post-Op.   Interval History:  Patient seen and examined No acute events or new complaints overnight.  Patient reports he continues to have abdominal discomfort No fever, chills, nausea, emesis Leukocytosis improved this morning; 12.3K Hgb to 10.4 Surgical drain with 250 ccs in last 24 hours; serosanguinous  Advanced to soft diet; tolerating Having bowel function  Vital signs in last 24 hours: [min-max] current  Temp:  [98.1 F (36.7 C)-98.4 F (36.9 C)] 98.1 F (36.7 C) (08/07 0519) Pulse Rate:  [60-73] 60 (08/07 0519) Resp:  [18-20] 18 (08/07 0519) BP: (119-133)/(64-65) 119/65 (08/07 0519) SpO2:  [99 %-100 %] 99 % (08/07 0519) Weight:  [87.4 kg] 87.4 kg (08/07 0500)     Height: 5\' 10"  (177.8 cm) Weight: 87.4 kg BMI (Calculated): 27.65   Intake/Output last 2 shifts:  08/06 0701 - 08/07 0700 In: 1031.4 [I.V.:941.4] Out: 1500 [Urine:1250; Drains:250]   Physical Exam:  Constitutional: alert, cooperative and no distress  Respiratory: breathing non-labored at rest  Cardiovascular: regular rate and sinus rhythm  Gastrointestinal: Soft, incisional soreness, non-distended, no rebound/guarding. Surgical drain in LLQ; output serosanguinous  Integumentary: Laparotomy is intact with staples, penrose in place, no significant erythema, drainage from inferior aspect more serosanguinous. A few inferior staples removed without drainage or evidence of infection.   Midline wound (03/29/2023)   Labs:     Latest Ref Rng & Units 03/29/2023    5:58 AM 03/28/2023    5:09 AM 03/27/2023    6:04 AM  CBC  WBC 4.0 - 10.5 K/uL 12.3  11.1  13.3   Hemoglobin 13.0 - 17.0 g/dL 82.9  56.2  13.0   Hematocrit 39.0 - 52.0 % 32.8  31.3  33.5   Platelets 150 - 400 K/uL 259  273  285       Latest Ref Rng & Units 03/28/2023    5:09 AM 03/27/2023    6:04 AM 03/25/2023    7:43 AM  CMP  Glucose 70 - 99 mg/dL 865   784  696   BUN 8 - 23 mg/dL 38  36  13   Creatinine 0.61 - 1.24 mg/dL 2.95  2.84  1.32   Sodium 135 - 145 mmol/L 132  137  138   Potassium 3.5 - 5.1 mmol/L 4.7  4.8  3.8   Chloride 98 - 111 mmol/L 106  111  109   CO2 22 - 32 mmol/L 22  20  24    Calcium 8.9 - 10.3 mg/dL 8.7  8.7  8.5   Total Protein 6.5 - 8.1 g/dL  4.4    Total Bilirubin 0.3 - 1.2 mg/dL  0.8    Alkaline Phos 38 - 126 U/L  67    AST 15 - 41 U/L  20    ALT 0 - 44 U/L  23       Imaging studies: No new imaging studies  Assessment/Plan: 89 y.o. male with rising leukocytosis 11 Days Post-Op s/p exploratory laparotomy and right colectomy secondary to perforated right colon cancer    - Continue soft diet  - Continue TPN; at goal for now - hopefully start to wean in next 24 hours - Continue IV Abx (Zosyn) - Follow up leukocytosis; no clear infectious source - Monitor abdominal examination; on-going bowel function              - Continue surgical drain; monitor and record output             -  Pain control prn; antiemetics prn - Mobilize with therapies; doing well considering             - Pathology reviewed - CEA normal. Will likely involve oncology at some point this admission pending further work-up (CT Chest, MRI)  All of the above findings and recommendations were discussed with the patient, and the medical team, and all of patient's questions were answered to his expressed satisfaction.  -- [name], PA-C [facility] 03/29/2023, 7:37 AM M-F: 7am - 4pm

## 2023-03-29 NOTE — Progress Notes (Signed)
Physical Therapy Treatment Patient Details Name: Don Arias MRN: [id number] DOB: 04/18/1930 Today's Date: 03/29/2023   History of Present Illness Pt is a 89 yo M status post exploratory laparotomy and right hemicolectomy for right colon perforation likely from colonic obstruction.  PMH includes skin CA, HTN, and anxiety.    PT Comments  Pt pleasant, initially not too eager to get up and move, but getting IV pain meds and encouraging words ultimately did get him to participate well.  He was slow and labored getting to EOB, but motivated to do the work himself, heavy UE use and cuing but needing little actual assist.  Similarly slow and labored to get to standing but with good effort and plenty of cuing for sequencing, set up and encouragement he again did well with minimal actual assist.  Pt was able to ambulate ~55 ft into the hallway (chair following), initially with very slow and inconsistent gait, but with cuing for posture, cadence, walker use he was able to improve consistency, speed and ultimately was able to do far more than he had been able to prior efforts.  Pt continues to be far from his baseline and will benefit form continued PT per POC.      If plan is discharge home, recommend the following: A lot of help with bathing/dressing/bathroom;Assistance with cooking/housework;Direct supervision/assist for medications management;Help with stairs or ramp for entrance;Assist for transportation;A little help with walking and/or transfers   Can travel by private vehicle     No  Equipment Recommendations   (TBD)    Recommendations for Other Services       Precautions / Restrictions Precautions Precautions: Fall Precaution Comments: JP drain Restrictions Weight Bearing Restrictions: No Other Position/Activity Restrictions: Pt declined use on abd binder on 03/28/23     Mobility  Bed Mobility Overal bed mobility: Needs Assistance Bed Mobility: Supine to Sit Rolling: Min  assist Sidelying to sit: Min assist Supine to sit: Min assist     General bed mobility comments: increased time, heavy use of rails, determinded to do most of the effort w/o assist, light assist with trunk due to abd pain    Transfers Overall transfer level: Needs assistance Equipment used: Rolling walker (2 wheels) Transfers: Sit to/from Stand Sit to Stand: Contact guard assist           General transfer comment: CGA, slow but needing on guidance tactile and verbal cues, pain limiting.  Needed multiple resets before successful rise to standing from elevated bed height    Ambulation/Gait Ambulation/Gait assistance: Contact guard assist Gait Distance (Feet): 55 Feet Assistive device: Rolling walker (2 wheels)         General Gait Details: Pt with inconsistent gait but with cuing able to increase cadence, speed, posture and consistency.  Pt initially taking a few steps and then needing to rest, rest posture, etc.  Pt with some fatigue but no excessive SOB/DOE, c/o back pain more than abdominal pain but cuing to walker use/posture seemed to help.  Pt very pleased to have been able to get out into the hallway   Stairs             Wheelchair Mobility     Tilt Bed    Modified Rankin (Stroke Patients Only)       Balance   Sitting-balance support: Feet supported, Bilateral upper extremity supported Sitting balance-Leahy Scale: Good     Standing balance support: During functional activity, Single extremity supported Standing balance-Leahy Scale: Fair Standing balance  comment: able to maintain static standing, single UE use while using the urinal at EOB.  maintains balance briefly w/o UE support but encouraged to use walker at this time                            Cognition Arousal: Alert Behavior During Therapy: [facility] for tasks assessed/performed Overall Cognitive Status: Within Functional Limits for tasks assessed                                  General Comments: continues to need consistent cuing to motivate to next task, likes his own pace        Exercises      General Comments General comments (skin integrity, edema, etc.): Pt with slow but determined effort as his pace, made nice gains with ambulation distance/tolerance      Pertinent Vitals/Pain Pain Assessment Pain Assessment: Faces Faces Pain Scale: Hurts even more Pain Location: abdomen, lower back Pain Intervention(s): Limited activity within patient's tolerance    Home Living                          Prior Function            PT Goals (current goals can now be found in the care plan section) Progress towards PT goals: Progressing toward goals    Frequency    Min 1X/week      PT Plan Current plan remains appropriate    Co-evaluation              AM-PAC PT "6 Clicks" Mobility   Outcome Measure  Help needed turning from your back to your side while in a flat bed without using bedrails?: A Little Help needed moving from lying on your back to sitting on the side of a flat bed without using bedrails?: A Little Help needed moving to and from a bed to a chair (including a wheelchair)?: A Little Help needed standing up from a chair using your arms (e.g., wheelchair or bedside chair)?: A Little Help needed to walk in hospital room?: A Little Help needed climbing 3-5 steps with a railing? : Total 6 Click Score: 16    End of Session Equipment Utilized During Treatment: Gait belt Activity Tolerance: Patient limited by pain;Patient tolerated treatment well Patient left: in chair;with call bell/phone within reach;with family/visitor present Nurse Communication: Mobility status PT Visit Diagnosis: Other abnormalities of gait and mobility (R26.89);Muscle weakness (generalized) (M62.81);Difficulty in walking, not elsewhere classified (R26.2);Pain Pain - part of body:  (abdomen, lumbago)     Time: 5621-3086 PT Time Calculation  (min) (ACUTE ONLY): 49 min  Charges:    $Gait Training: 23-37 mins $Therapeutic Activity: 8-22 mins PT General Charges $$ ACUTE PT VISIT: 1 Visit                     Malachi Pro, DPT 03/29/2023, 6:11 PM

## 2023-03-29 NOTE — TOC Progression Note (Signed)
Transition of Care [facility]) - Progression Note    Patient Details  Name: Don Arias MRN: [id number] Date of Birth: 1929-09-03  [facility]) CM/SW Contact  Margarito Liner, LCSW Phone Number: 03/29/2023, 9:36 AM  Clinical Narrative: CSW continues to follow for discharge needs. Will start insurance authorization for [facility] once the surgical team starts to wean TPN.    Expected Discharge Plan: Skilled Nursing Facility Barriers to Discharge: Continued Medical Work up  Expected Discharge Plan and Services     Post Acute Care Choice:  (TBD) Living arrangements for the past 2 months: Single Family Home                                       Social Determinants of Health (SDOH) Interventions SDOH Screenings   Food Insecurity: No Food Insecurity (03/19/2023)  Housing: Low Risk  (03/19/2023)  Transportation Needs: No Transportation Needs (03/19/2023)  Utilities: Not At Risk (03/19/2023)  Tobacco Use: Medium Risk (03/18/2023)    Readmission Risk Interventions     No data to display

## 2023-03-30 LAB — URINALYSIS, ROUTINE W REFLEX MICROSCOPIC
Bacteria, UA: NONE SEEN
Bilirubin Urine: NEGATIVE
Glucose, UA: NEGATIVE mg/dL
Ketones, ur: NEGATIVE mg/dL
Leukocytes,Ua: NEGATIVE
Nitrite: NEGATIVE
Protein, ur: NEGATIVE mg/dL
Specific Gravity, Urine: 1.017 (ref 1.005–1.030)
Squamous Epithelial / HPF: NONE SEEN /HPF (ref 0–5)
pH: 5 (ref 5.0–8.0)

## 2023-03-30 LAB — GLUCOSE, CAPILLARY
Glucose-Capillary: 117 mg/dL — ABNORMAL HIGH (ref 70–99)
Glucose-Capillary: 150 mg/dL — ABNORMAL HIGH (ref 70–99)
Glucose-Capillary: 161 mg/dL — ABNORMAL HIGH (ref 70–99)
Glucose-Capillary: 165 mg/dL — ABNORMAL HIGH (ref 70–99)

## 2023-03-30 MED ORDER — CALCIUM CARBONATE ANTACID 500 MG PO CHEW: 2.0000 | CHEWABLE_TABLET | Freq: Three times a day (TID) | ORAL | Status: DC | PRN

## 2023-03-30 MED ORDER — TRAVASOL 10 % IV SOLN
INTRAVENOUS | Status: AC
  Filled 2023-03-30: qty 972

## 2023-03-30 NOTE — Progress Notes (Signed)
Occupational Therapy Treatment Patient Details Name: Don Arias MRN: [id number] DOB: [birth year] Today's Date: 03/30/2023   History of present illness Pt is a 89 yo M status post exploratory laparotomy and right hemicolectomy for right colon perforation likely from colonic obstruction.  PMH includes skin CA, HTN, and anxiety.   OT comments  Don Arias was seen for OT treatment on this date. Upon arrival to room pt working with PT performing functional mobility, agreeable to carrying over to OT Tx session. OT facilitated ADL management as described below. See ADL section for additional details regarding occupational performance. Pt continues to be functionally limited by decreased balance, decreased safety awareness, increased pain with mobility, and decreased activity tolerance. Pt return verbalizes understanding of education provided t/o session. Pt is progressing toward OT goals and continues to benefit from skilled OT services to maximize return to PLOF and minimize risk of future falls, injury, caregiver burden, and readmission. Will continue to follow POC as written. Discharge recommendation remains appropriate.        If plan is discharge home, recommend the following:  A lot of help with bathing/dressing/bathroom;Assist for transportation;Assistance with cooking/housework;Help with stairs or ramp for entrance;A little help with walking and/or transfers   Equipment Recommendations  Other (comment)    Recommendations for Other Services      Precautions / Restrictions Precautions Precautions: Fall Precaution Comments: JP drain Restrictions Weight Bearing Restrictions: No       Mobility Bed Mobility Overal bed mobility: Needs Assistance             General bed mobility comments: deferred pt up with PT at start of session.    Transfers Overall transfer level: Needs assistance Equipment used: Rolling walker (2 wheels) Transfers: Sit to/from Stand Sit to Stand:  Supervision, Contact guard assist           General transfer comment: CGA for initial STS but is able to progress to supervision during functional mobility     Balance Overall balance assessment: Needs assistance Sitting-balance support: Feet supported, Bilateral upper extremity supported Sitting balance-Leahy Scale: Good Sitting balance - Comments: req use of BUE for support to manage pain level with readjustments   Standing balance support: During functional activity, Single extremity supported Standing balance-Leahy Scale: Fair Standing balance comment: able to maintain static standing, single UE use while using the urinal. Benefits from use of RW t/o standing ADL management.                           ADL either performed or assessed with clinical judgement   ADL Overall ADL's : Needs assistance/impaired   Eating/Feeding Details (indicate cue type and reason): Now approved for full diet orders. Able to perform self-feeding independently, however apetite remains limited 2/2 increased abdominal pain.                         Toileting- Clothing Manipulation and Hygiene: Sit to/from stand;Contact guard assist Toileting - Clothing Manipulation Details (indicate cue type and reason): Pt able to stand near recliner to use urinal. Takes extended time.     Functional mobility during ADLs: Cueing for safety;Rolling walker (2 wheels);Contact guard assist      Extremity/Trunk Assessment Upper Extremity Assessment Upper Extremity Assessment: Generalized weakness   Lower Extremity Assessment Lower Extremity Assessment: Generalized weakness        Vision Patient Visual Report: No change from baseline     Perception  Praxis      Cognition Arousal: Alert Behavior During Therapy: WFL for tasks assessed/performed Overall Cognitive Status: Within Functional Limits for tasks assessed                                          Exercises  Other Exercises Other Exercises: Educated on benefits of sitting up in recliner/OOB, gentle colonic/scar massage with instructions to clean/sanitize hands prior to attempting.    Shoulder Instructions       General Comments      Pertinent Vitals/ Pain       Pain Assessment Pain Assessment: Faces Faces Pain Scale: Hurts even more Pain Location: abdomen, lower back Pain Descriptors / Indicators: Discomfort, Sharp, Guarding, Grimacing Pain Intervention(s): Monitored during session, Limited activity within patient's tolerance, Repositioned, Utilized relaxation techniques  Home Living Family/patient expects to be discharged to:: Private residence Living Arrangements: Alone                                      Prior Functioning/Environment              Frequency  Min 1X/week        Progress Toward Goals  OT Goals(current goals can now be found in the care plan section)  Progress towards OT goals: Progressing toward goals  Acute Rehab OT Goals Patient Stated Goal: improve pain OT Goal Formulation: With patient/family Time For Goal Achievement: 04/03/23 Potential to Achieve Goals: Good  Plan Discharge plan remains appropriate;Frequency remains appropriate    Co-evaluation                 AM-PAC OT "6 Clicks" Daily Activity     Outcome Measure   Help from another person eating meals?: None Help from another person taking care of personal grooming?: A Little Help from another person toileting, which includes using toliet, bedpan, or urinal?: A Little Help from another person bathing (including washing, rinsing, drying)?: A Lot Help from another person to put on and taking off regular upper body clothing?: A Little Help from another person to put on and taking off regular lower body clothing?: A Lot 6 Click Score: 17    End of Session Equipment Utilized During Treatment: Rolling walker (2 wheels)  OT Visit Diagnosis: Other abnormalities of  gait and mobility (R26.89);Muscle weakness (generalized) (M62.81);Pain   Activity Tolerance Patient tolerated treatment well   Patient Left in chair;with call bell/phone within reach;with chair alarm set   Nurse Communication Mobility status;Other (comment) (Pt nervous about staying up in chair for too long. Would like to go back to bed ASAP. Encouraged to stay up as long as can be tolerated.)        Time: 4098-1191 OT Time Calculation (min): 20 min  Charges: OT General Charges $OT Visit: 1 Visit OT Treatments $Self Care/Home Management : 8-22 mins  Rockney Ghee, M.S., OTR/L 03/30/23, 1:08 PM

## 2023-03-30 NOTE — Progress Notes (Signed)
PHARMACY - TOTAL PARENTERAL NUTRITION CONSULT NOTE   Indication: Prolonged ileus  Patient Measurements: Height: 5\' 10"  (177.8 cm) Weight: 87.7 kg (193 lb 5.5 oz) IBW/kg (Calculated) : 73 TPN AdjBW (KG): 81.6 Body mass index is 27.74 kg/m. Usual Weight:  82 kg on 03/26/2023  Assessment:   Glucose / Insulin:  No apparent history of diabetes Ordered sensitive SSI q6h BG last 24h: 130-161 6u SSI required last 24h Electrolytes: WNL Renal: Scr < 1 Hepatic: Within normal limits  Intake / Output: +5.2L MIVF: stopped GI Imaging: 7/27:Free intra-abdominal air. Bowel perforation until proven otherwise.The exact etiology of the free air can not be determined on this examination. There is a mass lesion identified along the ascending colon with focal caliber change of the bowel worrisome for potential neoplasm.There also some abnormal nodes in the right hemi abdominal mesentery and possible liver metastasis. Further workup when appropriate. This could be the source of the free air but indeterminate. There is some fluid adjacent to the ascending colon in the pericolic gutter. GI Surgeries / Procedures:  Exploratory laparotomy on 7/28 and right hemicolectomy for right colon perforation likely from colonic obstruction, feculent peritonitis   Central access: PICC 7/31 TPN start date: 7/31  Nutritional Goals: Goal TPN rate is 75 mL/hr(reduced from 53ml/hr d/t 12 pound weight gain this admission) (provides 97 g of protein and 2030 kcals per day)  RD Assessment: Estimated Needs Total Energy Estimated Needs: 1900-2200kcal/day Total Protein Estimated Needs: 95-110g/day Total Fluid Estimated Needs: 1.9-2.2L/day  Current Nutrition:  Full diet 8/8  Per surgery note 8/2: "Given continued ROBF, will proceed with NGT clamping trial. NGT clamped this morning. Check residuals at 1200. If residuals are <150 ccs, we can remove this and start diet"  Plan:  -continue TPN at goal rate of 75 mL/hr at  1800 --Electrolytes in TPN: Na 38mEq/L, K 20mEq/L, Ca 65mEq/L, Mg 67mEq/L, and Phos 40mmol/L. Cl:Ac 1:1 --Continue lipids in bag: 54 g --Add standard MVI and trace elements to TPN --Continue Sensitive q6h SSI and adjust as needed Advanced to full diet 8/8 (monitor for need to add insulin to TPN bag once at full rate). Holding off on taper of TPN until patient is able to tolerate. --Thiamine 100 mg IV x 5 days(completed) --Monitor TPN labs on Mon/Thurs or as needed  Barrie Folk, PharmD Clinical Pharmacist 03/30/2023 7:30 AM

## 2023-03-30 NOTE — Plan of Care (Signed)

## 2023-03-30 NOTE — Progress Notes (Signed)
[facility] SURGICAL PROGRESS NOTE  Hospital Day(s): 12.   Post op day(s): 12 Days Post-Op.   Interval History:  Patient seen and examined No acute events or new complaints overnight.  Patient reports he is doing okay; still with abdominal pain but feels it is better  No fever, chills, nausea, emesis Leukocytosis stable this morning; 12.2K Hgb to 9.7 Renal function normal; scr - 0.92; UO - 300 ccs No electrolyte derangements Surgical drain with 130 ccs in last 24 hours; serosanguinous  Advanced to soft diet; tolerating Having bowel function  Vital signs in last 24 hours: [min-max] current  Temp:  [98.1 F (36.7 C)-98.6 F (37 C)] 98.6 F (37 C) (08/08 0546) Pulse Rate:  [64-68] 66 (08/08 0546) Resp:  [15-18] 15 (08/08 0546) BP: (122-135)/(64-70) 135/68 (08/08 0546) SpO2:  [98 %-100 %] 98 % (08/08 0546) Weight:  [87.7 kg] 87.7 kg (08/08 0348)     Height: 5\' 10"  (177.8 cm) Weight: 87.7 kg BMI (Calculated): 27.74   Intake/Output last 2 shifts:  08/07 0701 - 08/08 0700 In: 1075.4 [P.O.:120; I.V.:705.4; IV Piggyback:250] Out: 430 [Urine:300; Drains:130]   Physical Exam:  Constitutional: alert, cooperative and no distress  Respiratory: breathing non-labored at rest  Cardiovascular: regular rate and sinus rhythm  Gastrointestinal: Soft, incisional soreness, non-distended, no rebound/guarding. Surgical drain in LLQ; output serosanguinous  Integumentary: Laparotomy is intact with staples, penrose in place, no significant erythema, drainage from inferior aspect more serosanguinous. A few inferior staples removed yesterday without drainage or evidence of infection.    Labs:     Latest Ref Rng & Units 03/30/2023    5:40 AM 03/29/2023    5:58 AM 03/28/2023    5:09 AM  CBC  WBC 4.0 - 10.5 K/uL 12.2  12.3  11.1   Hemoglobin 13.0 - 17.0 g/dL 9.7  09.8  11.9   Hematocrit 39.0 - 52.0 % 29.6  32.8  31.3   Platelets 150 - 400 K/uL 273  259  273       Latest Ref Rng &  Units 03/30/2023    5:40 AM 03/28/2023    5:09 AM 03/27/2023    6:04 AM  CMP  Glucose 70 - 99 mg/dL 147  829  562   BUN 8 - 23 mg/dL 30  38  36   Creatinine 0.61 - 1.24 mg/dL 1.30  8.65  7.84   Sodium 135 - 145 mmol/L 136  132  137   Potassium 3.5 - 5.1 mmol/L 4.1  4.7  4.8   Chloride 98 - 111 mmol/L 109  106  111   CO2 22 - 32 mmol/L 23  22  20    Calcium 8.9 - 10.3 mg/dL 8.7  8.7  8.7   Total Protein 6.5 - 8.1 g/dL 4.5   4.4   Total Bilirubin 0.3 - 1.2 mg/dL 0.5   0.8   Alkaline Phos 38 - 126 U/L 64   67   AST 15 - 41 U/L 12   20   ALT 0 - 44 U/L 14   23      Imaging studies: No new imaging studies  Assessment/Plan: 89 y.o. male with rising leukocytosis 12 Days Post-Op s/p exploratory laparotomy and right colectomy secondary to perforated right colon cancer    - Will do regular diet to allow more options for PO intake   - Continue TPN; at goal for now - hopefully start to wean in next 24 hours - Continue IV Abx (Zosyn) -  Follow up leukocytosis; stable; no clear infectious source - Monitor abdominal examination; on-going bowel function              - Continue surgical drain; monitor and record output             - Pain control prn; antiemetics prn - Mobilize with therapies; doing well considering             - Pathology reviewed - CEA normal. Will likely involve oncology at some point this admission pending further work-up (CT Chest, MRI)  All of the above findings and recommendations were discussed with the patient, and the medical team, and all of patient's questions were answered to his expressed satisfaction.  -- [name], PA-C [facility] 03/30/2023, 7:40 AM M-F: 7am - 4pm

## 2023-03-30 NOTE — Care Management Important Message (Signed)
Important Message  Patient Details  Name: Don Arias MRN: [id number] Date of Birth: 01/27/1930   Medicare Important Message Given:  Yes     [name] 03/30/2023, 2:26 PM

## 2023-03-30 NOTE — Plan of Care (Signed)
  Problem: Clinical Measurements: Goal: Ability to maintain clinical measurements within normal limits will improve Outcome: Progressing   Problem: Clinical Measurements: Goal: Will remain free from infection Outcome: Progressing   Problem: Clinical Measurements: Goal: Diagnostic test results will improve Outcome: Progressing   Problem: Clinical Measurements: Goal: Respiratory complications will improve Outcome: Progressing   

## 2023-03-30 NOTE — Progress Notes (Addendum)
Physical Therapy Treatment Patient Details Name: Don Arias MRN: [id number] DOB: 15-Nov-1929 Today's Date: 03/30/2023   History of Present Illness Pt is a 89 yo M status post exploratory laparotomy and right hemicolectomy for right colon perforation likely from colonic obstruction.  PMH includes skin CA, HTN, and anxiety.    PT Comments  Pt was pleasant and motivated to participate during the session and put forth good effort throughout. Initial session readings of 97% SpO2 on room air and HR 83 bpm. Pt preformed 2x STS from varying heights and surfaces to RW at [facility], able to ambulate 30 feet with RW and chair follow for safety. PT provided consistent verbal and tactile cues resulting in improvements of speed, posture, and consistency during gait. Pt will benefit from continued PT services upon discharge to safely address deficits listed in patient problem list for decreased caregiver assistance and eventual return to PLOF.      If plan is discharge home, recommend the following: A lot of help with bathing/dressing/bathroom;Assistance with cooking/housework;Direct supervision/assist for medications management;Help with stairs or ramp for entrance;Assist for transportation;A little help with walking and/or transfers   Can travel by private vehicle     No  Equipment Recommendations  Other (comment) (TBD at next venue)    Recommendations for Other Services       Precautions / Restrictions Precautions Precautions: Fall Precaution Comments: JP drain Restrictions Weight Bearing Restrictions: No     Mobility  Bed Mobility               General bed mobility comments: Pt found seated at EOB, bed mobility not preformed during this session    Transfers Overall transfer level: Needs assistance Equipment used: Rolling walker (2 wheels) Transfers: Sit to/from Stand Sit to Stand: Contact guard assist           General transfer comment: Slow but able to complete without  assist, needs cues to promote timely transfers. STS preformed from multiple heights and surfaces throughout session.    Ambulation/Gait Ambulation/Gait assistance: Contact guard assist Gait Distance (Feet): 30 Feet Assistive device: Rolling walker (2 wheels) Gait Pattern/deviations: Step-through pattern, Decreased step length - right, Decreased step length - left, Decreased stride length, Antalgic Gait velocity: decreased     General Gait Details: Continued consistent cueing to keep upright and "walk normal" yeilded a greater increase in overall gait pattern, quality and speed. Ambulatory bout ended d/t pt urgency sensation   Stairs             Wheelchair Mobility     Tilt Bed    Modified Rankin (Stroke Patients Only)       Balance Overall balance assessment: Needs assistance Sitting-balance support: Feet supported, Bilateral upper extremity supported Sitting balance-Leahy Scale: Good     Standing balance support: During functional activity, Single extremity supported Standing balance-Leahy Scale: Fair                              Cognition Arousal: Alert Behavior During Therapy: WFL for tasks assessed/performed Overall Cognitive Status: Within Functional Limits for tasks assessed                                 General Comments: continues to need consistent cuing for task at hand        Exercises      General Comments  Pertinent Vitals/Pain Pain Assessment Pain Assessment: Faces Faces Pain Scale: Hurts a little bit Pain Location: abdomen, lower back Pain Descriptors / Indicators: Discomfort, Sharp, Guarding, Grimacing Pain Intervention(s): Monitored during session, Limited activity within patient's tolerance    Home Living                          Prior Function            PT Goals (current goals can now be found in the care plan section) Progress towards PT goals: Progressing toward goals     Frequency    Min 1X/week      PT Plan      Co-evaluation              AM-PAC PT "6 Clicks" Mobility   Outcome Measure  Help needed turning from your back to your side while in a flat bed without using bedrails?: A Little Help needed moving from lying on your back to sitting on the side of a flat bed without using bedrails?: A Little Help needed moving to and from a bed to a chair (including a wheelchair)?: A Little Help needed standing up from a chair using your arms (e.g., wheelchair or bedside chair)?: A Little Help needed to walk in hospital room?: A Little Help needed climbing 3-5 steps with a railing? : Total 6 Click Score: 16    End of Session Equipment Utilized During Treatment: Gait belt Activity Tolerance: Patient limited by pain;Patient tolerated treatment well Patient left: in chair (In care of OT) Nurse Communication: Mobility status PT Visit Diagnosis: Other abnormalities of gait and mobility (R26.89);Muscle weakness (generalized) (M62.81);Difficulty in walking, not elsewhere classified (R26.2);Pain Pain - part of body:  (abdomen)     Time: 1130-1200 PT Time Calculation (min) (ACUTE ONLY): 30 min  Charges:                           Cecile Sheerer, SPT 03/30/23, 12:59 PM This entire session was performed under direct supervision and direction of a licensed therapist/therapist assistant. I have personally read, edited and approve of the note as written.  Loran Senters, DPT

## 2023-03-30 NOTE — TOC Progression Note (Signed)
Transition of Care [facility]) - Progression Note    Patient Details  Name: KINGSTEN BAYANI MRN: [id number] Date of Birth: 1930-03-18  [facility]) CM/SW Contact  Chapman Fitch, RN Phone Number: 03/30/2023, 9:54 AM  Clinical Narrative:      Per Surgery continue TPN, and potentially start to wean in 24 hours.  Will start insurance auth at that time  Expected Discharge Plan: Skilled Nursing Facility Barriers to Discharge: Continued Medical Work up  Expected Discharge Plan and Services     Post Acute Care Choice:  (TBD) Living arrangements for the past 2 months: Single Family Home                                       Social Determinants of Health (SDOH) Interventions SDOH Screenings   Food Insecurity: No Food Insecurity (03/19/2023)  Housing: Low Risk  (03/19/2023)  Transportation Needs: No Transportation Needs (03/19/2023)  Utilities: Not At Risk (03/19/2023)  Tobacco Use: Medium Risk (03/18/2023)    Readmission Risk Interventions     No data to display

## 2023-03-31 ENCOUNTER — Inpatient Hospital Stay: Payer: Medicare Other

## 2023-03-31 ENCOUNTER — Encounter: Payer: Self-pay | Admitting: Surgery

## 2023-03-31 LAB — BASIC METABOLIC PANEL
Anion gap: 6 (ref 5–15)
BUN: 24 mg/dL — ABNORMAL HIGH (ref 8–23)
CO2: 22 mmol/L (ref 22–32)
Calcium: 8.9 mg/dL (ref 8.9–10.3)
Chloride: 110 mmol/L (ref 98–111)
Creatinine, Ser: 0.85 mg/dL (ref 0.61–1.24)
GFR, Estimated: >60 mL/min (ref >60)
Glucose, Bld: 198 mg/dL — ABNORMAL HIGH (ref 70–99)
Potassium: 4.3 mmol/L (ref 3.5–5.1)
Sodium: 138 mmol/L (ref 135–145)

## 2023-03-31 LAB — GLUCOSE, CAPILLARY
Glucose-Capillary: 194 mg/dL — ABNORMAL HIGH (ref 70–99)
Glucose-Capillary: 165 mg/dL — ABNORMAL HIGH (ref 70–99)
Glucose-Capillary: 132 mg/dL — ABNORMAL HIGH (ref 70–99)
Glucose-Capillary: 132 mg/dL — ABNORMAL HIGH (ref 70–99)

## 2023-03-31 MED ORDER — DIPHENHYDRAMINE HCL 25 MG PO CAPS: 25.0000 mg | ORAL_CAPSULE | Freq: Three times a day (TID) | ORAL | Status: DC | PRN

## 2023-03-31 MED ORDER — IOHEXOL 9 MG/ML PO SOLN
500.0000 mL | ORAL | Status: AC
  Administered 2023-03-31 (×2): 500 mL via ORAL

## 2023-03-31 MED ORDER — TAMSULOSIN HCL 0.4 MG PO CAPS
0.4000 mg | ORAL_CAPSULE | Freq: Every day | ORAL | Status: DC
  Administered 2023-03-31 – 2023-04-04 (×5): 0.4 mg via ORAL
  Filled 2023-03-31 (×5): qty 1

## 2023-03-31 MED ORDER — IOHEXOL 300 MG/ML  SOLN
100.0000 mL | Freq: Once | INTRAMUSCULAR | Status: AC | PRN
  Administered 2023-03-31: 100 mL via INTRAVENOUS

## 2023-03-31 MED ORDER — TRAVASOL 10 % IV SOLN
INTRAVENOUS | Status: AC
  Filled 2023-03-31: qty 972

## 2023-03-31 NOTE — Plan of Care (Signed)

## 2023-03-31 NOTE — Progress Notes (Addendum)
[facility] SURGICAL PROGRESS NOTE  Hospital Day(s): 13.   Post op day(s): 13 Days Post-Op.   Interval History:  Patient seen and examined Overnight with urinary retention; foley placed - UO unmeasured Patient reports he "feels better than he did last night" Abdominal pain improved No fever, chills, nausea, emesis Continues to have leukocytosis; now 15.8K Hgb to 11.2 BMP added on this AM - pending  Surgical drain with 200 ccs in last 24 hours; serous Advanced to soft diet; tolerating Having bowel function  Vital signs in last 24 hours: [min-max] current  Temp:  [97.5 F (36.4 C)-98.3 F (36.8 C)] 97.5 F (36.4 C) (08/09 0430) Pulse Rate:  [65-75] 75 (08/09 0430) Resp:  [16-20] 20 (08/09 0430) BP: (97-153)/(67-78) 153/78 (08/09 0430) SpO2:  [97 %-100 %] 97 % (08/09 0430) Weight:  [87.7 kg] 87.7 kg (08/09 0450)     Height: 5\' 10"  (177.8 cm) Weight: 87.7 kg BMI (Calculated): 27.74   Intake/Output last 2 shifts:  08/08 0701 - 08/09 0700 In: -  Out: 200 [Drains:200]   Physical Exam:  Constitutional: alert, cooperative and no distress  Respiratory: breathing non-labored at rest  Cardiovascular: regular rate and sinus rhythm  Gastrointestinal: Soft, incisional soreness, non-distended, no rebound/guarding. Surgical drain in LLQ; output serous Genitourinary: Foley in place; good UO Integumentary: Laparotomy is intact with staples, penrose in place, no significant erythema, drainage from inferior aspect more serous. Again, staples removed from inferior aspect on 08/07; drainage remains serous   Labs:     Latest Ref Rng & Units 03/31/2023    5:42 AM 03/30/2023    5:40 AM 03/29/2023    5:58 AM  CBC  WBC 4.0 - 10.5 K/uL 15.8  12.2  12.3   Hemoglobin 13.0 - 17.0 g/dL 13.0  9.7  86.5   Hematocrit 39.0 - 52.0 % 35.0  29.6  32.8   Platelets 150 - 400 K/uL 312  273  259       Latest Ref Rng & Units 03/30/2023    5:40 AM 03/28/2023    5:09 AM 03/27/2023    6:04 AM  CMP   Glucose 70 - 99 mg/dL 784  696  295   BUN 8 - 23 mg/dL 30  38  36   Creatinine 0.61 - 1.24 mg/dL 2.84  1.32  4.40   Sodium 135 - 145 mmol/L 136  132  137   Potassium 3.5 - 5.1 mmol/L 4.1  4.7  4.8   Chloride 98 - 111 mmol/L 109  106  111   CO2 22 - 32 mmol/L 23  22  20    Calcium 8.9 - 10.3 mg/dL 8.7  8.7  8.7   Total Protein 6.5 - 8.1 g/dL 4.5   4.4   Total Bilirubin 0.3 - 1.2 mg/dL 0.5   0.8   Alkaline Phos 38 - 126 U/L 64   67   AST 15 - 41 U/L 12   20   ALT 0 - 44 U/L 14   23      Imaging studies: No new imaging studies  Assessment/Plan: 89 y.o. male with rising leukocytosis 13 Days Post-Op s/p exploratory laparotomy and right colectomy secondary to perforated right colon cancer    - Given his persistent leukocytosis without clear etiology, we will plan for repeating CT Chest/Abdomen/Pelvis to ensure no missed intra-abdominal etiology as well as to continue staging work up given malignancy noted on surgical pathology.    - Will do regular diet to  allow more options for PO intake   - Continue TPN; continue at goal today given need for further work up.  - Continue foley catheter; monitor UO - BMP pending; added Flomax - Continue IV Abx (Zosyn) - Follow up leukocytosis; continues to elevate - Monitor abdominal examination; on-going bowel function              - Continue surgical drain; monitor and record output             - Pain control prn; antiemetics prn - Mobilize with therapies; doing well considering             - Pathology reviewed - CEA normal; CT C/A/P pending today   - Discharge Planning: Still not ready given leukocytosis and pending work up today, would anticipate here through weekend.   All of the above findings and recommendations were discussed with the patient, and the medical team, and all of patient's questions were answered to his expressed satisfaction.  -- [name], PA-C [facility] 03/31/2023, 7:26 AM M-F: 7am - 4pm

## 2023-03-31 NOTE — TOC Progression Note (Signed)
Transition of Care [facility]) - Progression Note    Patient Details  Name: Don Arias MRN: [id number] Date of Birth: Jan 06, 1930  Transition [facility]) CM/SW Contact  Margarito Liner, LCSW Phone Number: 03/31/2023, 3:10 PM  Clinical Narrative: Per MD, will start weaning TPN over the weekend and patient will hopefully be stable for discharge by Monday. CSW left message for [facility] admissions coordinator to notify. CSW started insurance authorization.    Expected Discharge Plan: Skilled Nursing Facility Barriers to Discharge: Continued Medical Work up  Expected Discharge Plan and Services     Post Acute Care Choice:  (TBD) Living arrangements for the past 2 months: Single Family Home                                       Social Determinants of Health (SDOH) Interventions SDOH Screenings   Food Insecurity: No Food Insecurity (03/19/2023)  Housing: Low Risk  (03/19/2023)  Transportation Needs: No Transportation Needs (03/19/2023)  Utilities: Not At Risk (03/19/2023)  Tobacco Use: Medium Risk (03/18/2023)    Readmission Risk Interventions     No data to display

## 2023-03-31 NOTE — Progress Notes (Signed)
PHARMACY - TOTAL PARENTERAL NUTRITION CONSULT NOTE   Indication: Prolonged ileus  Patient Measurements: Height: 5\' 10"  (177.8 cm) Weight: 87.7 kg (193 lb 5.5 oz) IBW/kg (Calculated) : 73 TPN AdjBW (KG): 81.6 Body mass index is 27.74 kg/m. Usual Weight:  82 kg on 03/26/2023  Assessment:   Glucose / Insulin:  No apparent history of diabetes Ordered sensitive SSI q6h BG last 24h: 117-194 5u SSI required last 24h Electrolytes: WNL Renal: Scr < 1 Hepatic: Within normal limits  Intake / Output: +3.3L MIVF: stopped GI Imaging: 7/27:Free intra-abdominal air. Bowel perforation until proven otherwise.The exact etiology of the free air can not be determined on this examination. There is a mass lesion identified along the ascending colon with focal caliber change of the bowel worrisome for potential neoplasm.There also some abnormal nodes in the right hemi abdominal mesentery and possible liver metastasis. Further workup when appropriate. This could be the source of the free air but indeterminate. There is some fluid adjacent to the ascending colon in the pericolic gutter. GI Surgeries / Procedures:  Exploratory laparotomy on 7/28 and right hemicolectomy for right colon perforation likely from colonic obstruction, feculent peritonitis   Central access: PICC 7/31 TPN start date: 7/31  Nutritional Goals: Goal TPN rate is 75 mL/hr(reduced from 63ml/hr d/t 12 pound weight gain this admission) (provides 97 g of protein and 2030 kcals per day)  RD Assessment: Estimated Needs Total Energy Estimated Needs: 1900-2200kcal/day Total Protein Estimated Needs: 95-110g/day Total Fluid Estimated Needs: 1.9-2.2L/day  Current Nutrition:  Full diet 8/8  Per surgery note 8/2: "Given continued ROBF, will proceed with NGT clamping trial. NGT clamped this morning. Check residuals at 1200. If residuals are <150 ccs, we can remove this and start diet"  Plan:  -continue TPN at goal rate of 75 mL/hr at  1800 --Electrolytes in TPN: Na 73mEq/L, K 37mEq/L, Ca 64mEq/L, Mg 4mEq/L, and Phos 37mmol/L. Cl:Ac 1:1 --Continue lipids in bag: 54 g --Add standard MVI and trace elements to TPN --Continue Sensitive q6h SSI and adjust as needed Advanced to full diet 8/8 (monitor for need to add insulin to TPN bag once at full rate). Surgery holding off on taper of TPN due to persistent leukocytosis and need for further work-up. --Thiamine 100 mg IV x 5 days(completed) --Monitor TPN labs on Mon/Thurs or as needed  Barrie Folk, PharmD Clinical Pharmacist 03/31/2023 8:37 AM

## 2023-03-31 NOTE — Progress Notes (Signed)
OT Cancellation Note  Patient Details Name: Don Arias MRN: [id number] DOB: 06/15/1930   Cancelled Treatment:    Reason Eval/Treat Not Completed: Patient declined, no reason specified;Other (comment). OT continues to follow pt for therapy services. Upon arrival to room, pt with family supports at bedside. Per dtr "[name]" Pt has had a rough morning and would benefit from time to rest today. Pt politely declines OT services. Will hold at this time and re-attempt at a later date/time as available and pt medically appropriate for OT services.   Rockney Ghee, M.S., OTR/L 03/31/23, 2:17 PM

## 2023-03-31 NOTE — Plan of Care (Signed)
  Problem: Clinical Measurements: Goal: Will remain free from infection Outcome: Progressing Goal: Diagnostic test results will improve Outcome: Progressing   Problem: Nutrition: Goal: Adequate nutrition will be maintained Outcome: Progressing   Problem: Coping: Goal: Level of anxiety will decrease Outcome: Progressing   Problem: Pain Managment: Goal: General experience of comfort will improve Outcome: Progressing   Problem: Safety: Goal: Ability to remain free from injury will improve Outcome: Progressing   Problem: Skin Integrity: Goal: Risk for impaired skin integrity will decrease Outcome: Progressing

## 2023-03-31 NOTE — Consult Note (Signed)
Pharmacy Antibiotic Note  NISHAAN Arias is a 89 y.o. male admitted on 03/18/2023 with  intra-abdominal infection . PMH significant for GERD, HTN. Patient is s/p right colectomy secondary to perforated right colon cancer, now with worsening WBC. Patient completed a 5d course of Zosyn on 8/3. Pharmacy has been consulted for Zosyn dosing.  Plan: Continue Zoysn 3.375 g IV Q8H (4 hours infusion) Continue to monitor renal function and follow culture results   Height: 5\' 10"  (177.8 cm) Weight: 87.7 kg (193 lb 5.5 oz) IBW/kg (Calculated) : 73  Temp (24hrs), Avg:97.9 F (36.6 C), Min:97.5 F (36.4 C), Max:98.3 F (36.8 C)  Recent Labs  Lab 03/25/23 0743 03/27/23 0415 03/27/23 0604 03/28/23 0509 03/29/23 0558 03/30/23 0540 03/31/23 0542  WBC 8.1   < > 13.3* 11.1* 12.3* 12.2* 15.8*  CREATININE 0.69  --  1.04 1.03  --  0.92  --    < > = values in this interval not displayed.    Estimated Creatinine Clearance: 59.6 mL/min (by C-G formula based on SCr of 0.92 mg/dL).    Allergies  Allergen Reactions   Nitroglycerin Other (See Comments)    Cardiac Arrest   Gabapentin     Other Reaction(s): Coordination problem   Hydralazine Hcl     Other Reaction(s): Unknown   Lisinopril Cough    Other reaction(s): Cough   Pregabalin     Other Reaction(s): Dizziness  Other reaction(s): Dizziness   Tramadol     Other Reaction(s): Other (See Comments), Unknown   Valsartan     Other Reaction(s): Cough  Other reaction(s): Cough   Verapamil     Other Reaction(s): Dizziness  Other reaction(s): Dizziness   Guaifenesin Rash   Sulfa Antibiotics Rash    Antimicrobials this admission: 7/28 Zosyn >> 8/3 8/5 Zosyn >>   Dose adjustments this admission: N/A  Microbiology results: N/A  Thank you for allowing pharmacy to be a part of this patient's care.  Barrie Folk, PharmD Clinical Pharmacist 03/31/2023 8:39 AM

## 2023-04-01 LAB — BASIC METABOLIC PANEL
Anion gap: 4 — ABNORMAL LOW (ref 5–15)
BUN: 19 mg/dL (ref 8–23)
CO2: 24 mmol/L (ref 22–32)
Calcium: 8.6 mg/dL — ABNORMAL LOW (ref 8.9–10.3)
Chloride: 110 mmol/L (ref 98–111)
Creatinine, Ser: 0.83 mg/dL (ref 0.61–1.24)
GFR, Estimated: >60 mL/min (ref >60)
Glucose, Bld: 119 mg/dL — ABNORMAL HIGH (ref 70–99)
Potassium: 4 mmol/L (ref 3.5–5.1)
Sodium: 138 mmol/L (ref 135–145)

## 2023-04-01 LAB — CBC
HCT: 31 % — ABNORMAL LOW (ref 39.0–52.0)
Hemoglobin: 9.8 g/dL — ABNORMAL LOW (ref 13.0–17.0)
MCH: 28.6 pg (ref 26.0–34.0)
MCHC: 31.6 g/dL (ref 30.0–36.0)
MCV: 90.4 fL (ref 80.0–100.0)
Platelets: 274 10*3/uL (ref 150–400)
RBC: 3.43 MIL/uL — ABNORMAL LOW (ref 4.22–5.81)
RDW: 14.8 % (ref 11.5–15.5)
WBC: 11.2 10*3/uL — ABNORMAL HIGH (ref 4.0–10.5)
nRBC: 0.3 % — ABNORMAL HIGH (ref 0.0–0.2)

## 2023-04-01 LAB — GLUCOSE, CAPILLARY
Glucose-Capillary: 152 mg/dL — ABNORMAL HIGH (ref 70–99)
Glucose-Capillary: 156 mg/dL — ABNORMAL HIGH (ref 70–99)
Glucose-Capillary: 157 mg/dL — ABNORMAL HIGH (ref 70–99)

## 2023-04-01 MED ORDER — TRAVASOL 10 % IV SOLN
INTRAVENOUS | Status: AC
  Filled 2023-04-01: qty 972

## 2023-04-01 NOTE — Plan of Care (Signed)
  Problem: Clinical Measurements: Goal: Ability to maintain clinical measurements within normal limits will improve Outcome: Progressing Goal: Will remain free from infection Outcome: Progressing Goal: Diagnostic test results will improve Outcome: Progressing Goal: Respiratory complications will improve Outcome: Progressing Goal: Cardiovascular complication will be avoided Outcome: Progressing   Problem: Coping: Goal: Level of anxiety will decrease Outcome: Progressing   Problem: Elimination: Goal: Will not experience complications related to bowel motility Outcome: Progressing Goal: Will not experience complications related to urinary retention Outcome: Progressing   Problem: Elimination: Goal: Will not experience complications related to urinary retention Outcome: Progressing   Problem: Pain Managment: Goal: General experience of comfort will improve Outcome: Progressing   Problem: Safety: Goal: Ability to remain free from injury will improve Outcome: Progressing   Problem: Skin Integrity: Goal: Risk for impaired skin integrity will decrease Outcome: Progressing

## 2023-04-01 NOTE — Progress Notes (Signed)
Patient ID: Don Arias, male   DOB: [birth year], 89 y.o.   MRN: [id number]     SURGICAL PROGRESS NOTE   Hospital Day(s): 14.   Interval History: Patient seen and examined, no acute events or new complaints overnight. Patient reports feeling better this morning.  She understood that he was able to tolerate diet yesterday but still not eating a lot.  He still uncomfortable.  Foley in place.  Adequate urine output.  Vital signs in last 24 hours: [min-max] current  Temp:  [98.1 F (36.7 C)-98.7 F (37.1 C)] 98.5 F (36.9 C) (08/10 0535) Pulse Rate:  [72-76] 72 (08/10 0535) Resp:  [18] 18 (08/10 0535) BP: (124-138)/(65-68) 126/68 (08/10 0535) SpO2:  [96 %-100 %] 96 % (08/10 0535) Weight:  [86.9 kg] 86.9 kg (08/10 0500)     Height: 5\' 10"  (177.8 cm) Weight: 86.9 kg BMI (Calculated): 27.49   Physical Exam:  Constitutional: alert, cooperative and no distress  Respiratory: breathing non-labored at rest  Cardiovascular: regular rate and sinus rhythm  Gastrointestinal: soft, non-tender, and non-distended.  The wounds are dry and clean  Labs:     Latest Ref Rng & Units 04/01/2023    6:27 AM 03/31/2023    5:42 AM 03/30/2023    5:40 AM  CBC  WBC 4.0 - 10.5 K/uL 11.2  15.8  12.2   Hemoglobin 13.0 - 17.0 g/dL 9.8  40.1  9.7   Hematocrit 39.0 - 52.0 % 31.0  35.0  29.6   Platelets 150 - 400 K/uL 274  312  273       Latest Ref Rng & Units 04/01/2023    6:27 AM 03/31/2023    9:10 AM 03/30/2023    5:40 AM  CMP  Glucose 70 - 99 mg/dL 027  253  664   BUN 8 - 23 mg/dL 19  24  30    Creatinine 0.61 - 1.24 mg/dL 4.03  4.74  2.59   Sodium 135 - 145 mmol/L 138  138  136   Potassium 3.5 - 5.1 mmol/L 4.0  4.3  4.1   Chloride 98 - 111 mmol/L 110  110  109   CO2 22 - 32 mmol/L 24  22  23    Calcium 8.9 - 10.3 mg/dL 8.6  8.9  8.7   Total Protein 6.5 - 8.1 g/dL   4.5   Total Bilirubin 0.3 - 1.2 mg/dL   0.5   Alkaline Phos 38 - 126 U/L   64   AST 15 - 41 U/L   12   ALT 0 - 44 U/L   14     Imaging  studies: No new pertinent imaging studies   Assessment/Plan:  89 y.o. male with perforated colon cancer status post right hemicolectomy 14 Days Post-Op.  -Today with stable vital signs -Pain is controlled -Improvement in leukocytosis -Not eating significant amount to wean TPN today.  Continue TPN and full rate. -Continue Foley catheter.  Continue Flomax due to urinary retention -Continue IV antibiotic therapy -Encouraged the patient to mobilize  Gae Gallop, MD

## 2023-04-01 NOTE — TOC Progression Note (Signed)
Transition of Care [facility]) - Progression Note    Patient Details  Name: Don Arias MRN: [id number] Date of Birth: [birth year]  [facility]) CM/SW Contact  Kemper Durie, RN Phone Number: 04/01/2023, 1:32 PM  Clinical Narrative:     [facility] remains pending.  Expected Discharge Plan: Skilled Nursing Facility Barriers to Discharge: Continued Medical Work up  Expected Discharge Plan and Services     Post Acute Care Choice:  (TBD) Living arrangements for the past 2 months: Single Family Home                                       Social Determinants of Health (SDOH) Interventions SDOH Screenings   Food Insecurity: No Food Insecurity (03/19/2023)  Housing: Low Risk  (03/19/2023)  Transportation Needs: No Transportation Needs (03/19/2023)  Utilities: Not At Risk (03/19/2023)  Tobacco Use: Medium Risk (03/18/2023)    Readmission Risk Interventions     No data to display

## 2023-04-01 NOTE — Progress Notes (Signed)
PHARMACY - TOTAL PARENTERAL NUTRITION CONSULT NOTE   Indication: Prolonged ileus  Patient Measurements: Height: 5\' 10"  (177.8 cm) Weight: 86.9 kg (191 lb 9.3 oz) IBW/kg (Calculated) : 73 TPN AdjBW (KG): 81.6 Body mass index is 27.49 kg/m. Usual Weight:  82 kg on 03/26/2023  Assessment:   Glucose / Insulin:  No apparent history of diabetes Ordered sensitive SSI q6h BG last 24h: 119-198 4u SSI required last 24h Electrolytes: WNL Renal: Scr < 1 Hepatic: Within normal limits  Intake / Output: +5L MIVF: stopped GI Imaging: 7/27:Free intra-abdominal air. Bowel perforation until proven otherwise.The exact etiology of the free air can not be determined on this examination. There is a mass lesion identified along the ascending colon with focal caliber change of the bowel worrisome for potential neoplasm.There also some abnormal nodes in the right hemi abdominal mesentery and possible liver metastasis. Further workup when appropriate. This could be the source of the free air but indeterminate. There is some fluid adjacent to the ascending colon in the pericolic gutter. GI Surgeries / Procedures:  Exploratory laparotomy on 7/28 and right hemicolectomy for right colon perforation likely from colonic obstruction, feculent peritonitis   Central access: PICC 7/31 TPN start date: 7/31  Nutritional Goals: Goal TPN rate is 75 mL/hr(reduced from 26ml/hr d/t 12 pound weight gain this admission) (provides 97 g of protein and 2030 kcals per day)  RD Assessment: Estimated Needs Total Energy Estimated Needs: 1900-2200kcal/day Total Protein Estimated Needs: 95-110g/day Total Fluid Estimated Needs: 1.9-2.2L/day  Current Nutrition:  Full diet 8/8  Per surgery note 8/2: "Given continued ROBF, will proceed with NGT clamping trial. NGT clamped this morning. Check residuals at 1200. If residuals are <150 ccs, we can remove this and start diet"  Plan:  -continue TPN at goal rate of 75 mL/hr at  1800 --Electrolytes in TPN: Na 73mEq/L, K 49mEq/L, Ca 86mEq/L, Mg 62mEq/L, and Phos 56mmol/L. Cl:Ac 1:1 --Continue lipids in bag: 54 g --Add standard MVI and trace elements to TPN --Continue Sensitive q6h SSI and adjust as needed Advanced to full diet 8/8 (monitor for need to add insulin to TPN bag once at full rate). Surgery holding off on taper of TPN due to persistent leukocytosis and need for further work-up. --Thiamine 100 mg IV x 5 days(completed) --Monitor TPN labs on Mon/Thurs or as needed  Bettey Costa, PharmD Clinical Pharmacist 04/01/2023 8:44 AM

## 2023-04-02 LAB — GLUCOSE, CAPILLARY
Glucose-Capillary: 110 mg/dL — ABNORMAL HIGH (ref 70–99)
Glucose-Capillary: 125 mg/dL — ABNORMAL HIGH (ref 70–99)
Glucose-Capillary: 129 mg/dL — ABNORMAL HIGH (ref 70–99)
Glucose-Capillary: 130 mg/dL — ABNORMAL HIGH (ref 70–99)

## 2023-04-02 MED ORDER — TRAVASOL 10 % IV SOLN
INTRAVENOUS | Status: AC
  Filled 2023-04-02: qty 518.4

## 2023-04-02 NOTE — Progress Notes (Signed)
PHARMACY - TOTAL PARENTERAL NUTRITION CONSULT NOTE   Indication: Prolonged ileus  Patient Measurements: Height: 5\' 10"  (177.8 cm) Weight: 87 kg (191 lb 12.8 oz) IBW/kg (Calculated) : 73 TPN AdjBW (KG): 81.6 Body mass index is 27.52 kg/m. Usual Weight:  82 kg on 03/26/2023  Assessment:   Glucose / Insulin:  No apparent history of diabetes Ordered sensitive SSI q6h BG last 24h: 119-157 5u SSI required last 24h Electrolytes: WNL Renal: Scr < 1 Hepatic: Within normal limits  Intake / Output: +2.1L MIVF: stopped GI Imaging: 7/27:Free intra-abdominal air. Bowel perforation until proven otherwise.The exact etiology of the free air can not be determined on this examination. There is a mass lesion identified along the ascending colon with focal caliber change of the bowel worrisome for potential neoplasm.There also some abnormal nodes in the right hemi abdominal mesentery and possible liver metastasis. Further workup when appropriate. This could be the source of the free air but indeterminate. There is some fluid adjacent to the ascending colon in the pericolic gutter. GI Surgeries / Procedures:  Exploratory laparotomy on 7/28 and right hemicolectomy for right colon perforation likely from colonic obstruction, feculent peritonitis   Central access: PICC 7/31 TPN start date: 7/31  Nutritional Goals: Goal TPN rate is 75 mL/hr(reduced from 1ml/hr d/t 10 pound weight gain this admission) (provides 97 g of protein and 2030 kcals per day)  RD Assessment: Estimated Needs Total Energy Estimated Needs: 1900-2200kcal/day Total Protein Estimated Needs: 95-110g/day Total Fluid Estimated Needs: 1.9-2.2L/day  Current Nutrition:  Full diet 8/8  Per surgery note 8/2: "Given continued ROBF, will proceed with NGT clamping trial. NGT clamped this morning. Check residuals at 1200. If residuals are <150 ccs, we can remove this and start diet"  Plan:  --Will wean TPN rate down to 40 mL/hr at 1800,  in hopes to discontinue tomorrown --Electrolytes in TPN: Na 30mEq/L, K 62mEq/L, Ca 12mEq/L, Mg 78mEq/L, and Phos 40mmol/L. Cl:Ac 1:1 --Continue lipids in bag: 54 g --Add standard MVI and trace elements to TPN --Continue Sensitive q6h SSI and adjust as needed Advanced to full diet 8/8 (monitor for need to add insulin to TPN bag once at full rate). Surgery holding off on taper of TPN due to persistent leukocytosis and need for further work-up. --Thiamine 100 mg IV x 5 days(completed) --Monitor TPN labs on Mon/Thurs or as needed  Bettey Costa, PharmD Clinical Pharmacist 04/02/2023 10:29 AM

## 2023-04-02 NOTE — Progress Notes (Signed)
Patient ID: Don Arias, male   DOB: [birth year], 89 y.o.   MRN: [id number]     SURGICAL PROGRESS NOTE   Hospital Day(s): 15.   Interval History: Patient seen and examined, no acute events or new complaints overnight. Patient reports feeling okay this morning.  He was able to tolerate better diet yesterday.  Denies any nausea or vomiting.  Endorses continued passing gas.  Vital signs in last 24 hours: [min-max] current  Temp:  [98.1 F (36.7 C)-98.7 F (37.1 C)] 98.1 F (36.7 C) (08/11 0829) Pulse Rate:  [73-79] 79 (08/11 0829) Resp:  [18-20] 18 (08/11 0829) BP: (120-137)/(70-74) 137/71 (08/11 0829) SpO2:  [95 %-98 %] 98 % (08/11 0829) Weight:  [87 kg] 87 kg (08/11 0500)     Height: 5\' 10"  (177.8 cm) Weight: 87 kg BMI (Calculated): 27.52   Physical Exam:  Constitutional: alert, cooperative and no distress  Respiratory: breathing non-labored at rest  Cardiovascular: regular rate and sinus rhythm  Gastrointestinal: soft, non-tender, and non-distended.  The wound is without drainage or erythema.  The drain is serous.  Labs:     Latest Ref Rng & Units 04/01/2023    6:27 AM 03/31/2023    5:42 AM 03/30/2023    5:40 AM  CBC  WBC 4.0 - 10.5 K/uL 11.2  15.8  12.2   Hemoglobin 13.0 - 17.0 g/dL 9.8  40.9  9.7   Hematocrit 39.0 - 52.0 % 31.0  35.0  29.6   Platelets 150 - 400 K/uL 274  312  273       Latest Ref Rng & Units 04/01/2023    6:27 AM 03/31/2023    9:10 AM 03/30/2023    5:40 AM  CMP  Glucose 70 - 99 mg/dL 811  914  782   BUN 8 - 23 mg/dL 19  24  30    Creatinine 0.61 - 1.24 mg/dL 9.56  2.13  0.86   Sodium 135 - 145 mmol/L 138  138  136   Potassium 3.5 - 5.1 mmol/L 4.0  4.3  4.1   Chloride 98 - 111 mmol/L 110  110  109   CO2 22 - 32 mmol/L 24  22  23    Calcium 8.9 - 10.3 mg/dL 8.6  8.9  8.7   Total Protein 6.5 - 8.1 g/dL   4.5   Total Bilirubin 0.3 - 1.2 mg/dL   0.5   Alkaline Phos 38 - 126 U/L   64   AST 15 - 41 U/L   12   ALT 0 - 44 U/L   14     Imaging studies: No new  pertinent imaging studies   Assessment/Plan:  89 y.o. male with perforated colon cancer status post right hemicolectomy 15 Days Post-Op   -Patient continues slow but adequate progress -No clinical deterioration overnight -Tolerated very diet yesterday -Okay to wean TPN off -Continue Flomax today to plan to discontinue Foley early in the morning -Encourage patient to mobilize -Continue drain in place.  Continue to biotic therapy  Gae Gallop, MD

## 2023-04-02 NOTE — TOC Progression Note (Signed)
Transition of Care [facility]) - Progression Note    Patient Details  Name: Don Arias MRN: [id number] Date of Birth: 1930-05-14  [facility] CM/SW Contact  Kemper Durie, RN Phone Number: 04/02/2023, 1:03 PM  Clinical Narrative:     [facility] pending  Expected Discharge Plan: Skilled Nursing Facility Barriers to Discharge: Continued Medical Work up  Expected Discharge Plan and Services     Post Acute Care Choice:  (TBD) Living arrangements for the past 2 months: Single Family Home                                       Social Determinants of Health (SDOH) Interventions SDOH Screenings   Food Insecurity: No Food Insecurity (03/19/2023)  Housing: Low Risk  (03/19/2023)  Transportation Needs: No Transportation Needs (03/19/2023)  Utilities: Not At Risk (03/19/2023)  Tobacco Use: Medium Risk (03/18/2023)    Readmission Risk Interventions     No data to display

## 2023-04-02 NOTE — Progress Notes (Signed)
PT Cancellation Note  Patient Details Name: Don Arias MRN: [id number] DOB: [birth year]   Cancelled Treatment:    Reason Eval/Treat Not Completed: Fatigue/lethargy limiting ability to participate  Pt in bed,  assisted RN in care due to inc BM, rolling left/right with min a x 1 and linen changes.  Pt reports general malaise today and not feeling well.  Declined therapy at this time. Encouraged OOB later if he felt better.  Will return at a later time/date.   Danielle Dess 04/02/2023, 1:37 PM

## 2023-04-03 ENCOUNTER — Inpatient Hospital Stay: Payer: Medicare Other

## 2023-04-03 ENCOUNTER — Other Ambulatory Visit (HOSPITAL_COMMUNITY): Payer: Self-pay

## 2023-04-03 LAB — GLUCOSE, CAPILLARY
Glucose-Capillary: 121 mg/dL — ABNORMAL HIGH (ref 70–99)
Glucose-Capillary: 121 mg/dL — ABNORMAL HIGH (ref 70–99)
Glucose-Capillary: 131 mg/dL — ABNORMAL HIGH (ref 70–99)
Glucose-Capillary: 86 mg/dL (ref 70–99)

## 2023-04-03 LAB — CBC
HCT: 30.6 % — ABNORMAL LOW (ref 39.0–52.0)
Hemoglobin: 10 g/dL — ABNORMAL LOW (ref 13.0–17.0)
MCH: 29.4 pg (ref 26.0–34.0)
MCHC: 32.7 g/dL (ref 30.0–36.0)
MCV: 90 fL (ref 80.0–100.0)
Platelets: 248 10*3/uL (ref 150–400)
RBC: 3.4 MIL/uL — ABNORMAL LOW (ref 4.22–5.81)
RDW: 15.1 % (ref 11.5–15.5)
WBC: 8.5 10*3/uL (ref 4.0–10.5)
nRBC: 0 % (ref 0.0–0.2)

## 2023-04-03 MED ORDER — PANTOPRAZOLE SODIUM 40 MG PO TBEC
40.0000 mg | DELAYED_RELEASE_TABLET | Freq: Every day | ORAL | Status: DC
  Administered 2023-04-03: 40 mg via ORAL
  Filled 2023-04-03: qty 1

## 2023-04-03 MED ORDER — APIXABAN 5 MG PO TABS
5.0000 mg | ORAL_TABLET | Freq: Two times a day (BID) | ORAL | Status: DC
  Administered 2023-04-03 – 2023-04-04 (×2): 5 mg via ORAL
  Filled 2023-04-03 (×2): qty 1

## 2023-04-03 MED ORDER — GADOBUTROL 1 MMOL/ML IV SOLN
8.0000 mL | Freq: Once | INTRAVENOUS | Status: AC | PRN
  Administered 2023-04-03: 8 mL via INTRAVENOUS

## 2023-04-03 NOTE — Plan of Care (Signed)
  Problem: Pain Managment: Goal: General experience of comfort will improve Outcome: Progressing   Problem: Safety: Goal: Ability to remain free from injury will improve Outcome: Progressing   Problem: Skin Integrity: Goal: Risk for impaired skin integrity will decrease Outcome: Progressing   

## 2023-04-03 NOTE — Progress Notes (Signed)
Foley catheter discontinued as ordered.Patient tolerated same well.

## 2023-04-03 NOTE — TOC Progression Note (Signed)
Transition of Care [facility]) - Progression Note    Patient Details  Name: Don Arias MRN: [id number] Date of Birth: [birth year]  [facility]) CM/SW Contact  Margarito Liner, LCSW Phone Number: 04/03/2023, 8:21 AM  Clinical Narrative:  Auth approved: [id number]. Valid 8/11-8/14. [facility] admissions coordinator is aware.   Expected Discharge Plan: Skilled Nursing Facility Barriers to Discharge: Continued Medical Work up  Expected Discharge Plan and Services     Post Acute Care Choice:  (TBD) Living arrangements for the past 2 months: Single Family Home                                       Social Determinants of Health (SDOH) Interventions SDOH Screenings   Food Insecurity: No Food Insecurity (03/19/2023)  Housing: Low Risk  (03/19/2023)  Transportation Needs: No Transportation Needs (03/19/2023)  Utilities: Not At Risk (03/19/2023)  Tobacco Use: Medium Risk (03/18/2023)    Readmission Risk Interventions     No data to display

## 2023-04-03 NOTE — Progress Notes (Signed)
Critical called to Don Arias. RUE [place] - Nonocclusive DVT of the right axillary associated with PICC line. Thrombus also seen in the basilic vein associated with the PICC line. Orders being placed

## 2023-04-03 NOTE — Progress Notes (Signed)
OT Cancellation Note  Patient Details Name: Don Arias MRN: [id number] DOB: 1930-07-28   Cancelled Treatment:    Reason Eval/Treat Not Completed: Medical issues which prohibited therapy (pt with acute DVT; per MD, hold today)  Alvester Morin 04/03/2023, 1:40 PM

## 2023-04-03 NOTE — Care Management Important Message (Signed)
Important Message  Patient Details  Name: Don Arias MRN: [id number] Date of Birth: Jan 13, 1930   Medicare Important Message Given:  Yes     [name] 04/03/2023, 10:58 AM

## 2023-04-03 NOTE — Progress Notes (Signed)
[facility] SURGICAL PROGRESS NOTE  Hospital Day(s): 16.   Post op day(s): 16 Days Post-Op.   Interval History:  Patient seen and examined Foley out overnight; did void 300 ccs after removal with some blood Also with RUE swelling Abdominal pain is similar; seems to be waxing and waning No fever, chills, nausea, emesis CBC pending Renal function normal; sCr - 0.91; UO - 2300 ccs Mild hypermagnesemia to 2.6; o/w no electrolyte derangements Surgical drain output not recorded; serous Regular diet; tolerating TPN; 1/2 rate Having bowel function  Vital signs in last 24 hours: [min-max] current  Temp:  [97.9 F (36.6 C)-98.6 F (37 C)] 98.2 F (36.8 C) (08/12 0411) Pulse Rate:  [70-79] 73 (08/12 0411) Resp:  [18] 18 (08/12 0411) BP: (122-139)/(71-78) 139/78 (08/12 0411) SpO2:  [96 %-99 %] 96 % (08/12 0411) Weight:  [86.7 kg] 86.7 kg (08/12 0411)     Height: 5\' 10"  (177.8 cm) Weight: 86.7 kg BMI (Calculated): 27.43   Intake/Output last 2 shifts:  08/11 0701 - 08/12 0700 In: 712.7 [I.V.:404.7; IV Piggyback:308] Out: 2300 [Urine:2300]   Physical Exam:  Constitutional: alert, cooperative and no distress  Respiratory: breathing non-labored at rest  Cardiovascular: regular rate and sinus rhythm  Gastrointestinal: Soft, incisional soreness, non-distended, no rebound/guarding. Surgical drain in LLQ; output serous Integumentary: Laparotomy is intact with staples, penrose in place, no significant erythema, drainage from inferior aspect more serous. Again, staples removed from inferior aspect on 08/07; drainage remains serous MSK: RUE certainly more edematous compared to the left, no piutting edema, 2+ pulses in RUE distally   Labs:     Latest Ref Rng & Units 04/01/2023    6:27 AM 03/31/2023    5:42 AM 03/30/2023    5:40 AM  CBC  WBC 4.0 - 10.5 K/uL 11.2  15.8  12.2   Hemoglobin 13.0 - 17.0 g/dL 9.8  74.2  9.7   Hematocrit 39.0 - 52.0 % 31.0  35.0  29.6   Platelets  150 - 400 K/uL 274  312  273       Latest Ref Rng & Units 04/03/2023    4:17 AM 04/01/2023    6:27 AM 03/31/2023    9:10 AM  CMP  Glucose 70 - 99 mg/dL 595  638  756   BUN 8 - 23 mg/dL 19  19  24    Creatinine 0.61 - 1.24 mg/dL 4.33  2.95  1.88   Sodium 135 - 145 mmol/L 137  138  138   Potassium 3.5 - 5.1 mmol/L 3.8  4.0  4.3   Chloride 98 - 111 mmol/L 106  110  110   CO2 22 - 32 mmol/L 24  24  22    Calcium 8.9 - 10.3 mg/dL 8.6  8.6  8.9   Total Protein 6.5 - 8.1 g/dL 4.8     Total Bilirubin 0.3 - 1.2 mg/dL 0.6     Alkaline Phos 38 - 126 U/L 95     AST 15 - 41 U/L 21     ALT 0 - 44 U/L 19        Imaging studies: No new imaging studies  Assessment/Plan: 89 y.o. male 16 Days Post-Op s/p exploratory laparotomy and right colectomy secondary to perforated right colon cancer    - Will get RUE [place] to rule out PICC associated DVT  - Will also get MRI Liver to complete cancer staging given liver mass noted on previous imaging in setting of colon cancer   -  Continue regular diet to allow more options for PO intake   - Discontinue TPN today - Continue IV Abx (Zosyn) for now; If WBC improved or normalized, we can stop this - Follow up CBC  - Continue Flomax; monitor hematuria - likely secondary to irritation from foley catheter - Monitor abdominal examination; on-going bowel function              - Continue surgical drain; monitor and record output             - Pain control prn; antiemetics prn - Mobilize with therapies; plan for SNF   - Discharge Planning: Doing well, pending completion of work up today. If reassuring, and continues to do well, we can pursue discharge tomorrow (08/13).   All of the above findings and recommendations were discussed with the patient, and the medical team, and all of patient's questions were answered to his expressed satisfaction.  -- [name], PA-C  [facility] 04/03/2023, 7:42 AM M-F: 7am - 4pm

## 2023-04-03 NOTE — TOC Benefit Eligibility Note (Signed)
Pharmacy Patient [facility] verification completed.    The patient is insured through [facility]. Patient has Medicare and is not eligible for a copay card, but may be able to apply for patient assistance, if available.    Ran test claim for Eliquis and the current 30 day co-pay is $133.00.   This test claim was processed through [facility]- copay amounts may vary at other pharmacies due to pharmacy/plan contracts, or as the patient moves through the different stages of their insurance plan.

## 2023-04-03 NOTE — Progress Notes (Signed)
PT Cancellation Note  Patient Details Name: Don Arias MRN: [id number] DOB: October 05, 1929   Cancelled Treatment:    Reason Eval/Treat Not Completed: Medical issues which prohibited therapy: Per chart review [place] positive for RUE DVT.  Per conversation with MD picc line to be removed and anticoagulation to be initiated to address DVT. MD requested rehab services to be held this date.  Will attempt to see pt at a future date as medically appropriate.     Ovidio Hanger PT, DPT 04/03/23, 1:36 PM

## 2023-04-03 NOTE — Progress Notes (Signed)
PHARMACIST - PHYSICIAN COMMUNICATION  DR:   Aleen Campi  CONCERNING: IV to Oral Route Change Policy  RECOMMENDATION: This patient is receiving pantoprazole by the intravenous route.  Based on criteria approved by the Pharmacy and Therapeutics Committee, the intravenous medication(s) is/are being converted to the equivalent oral dose form(s).   DESCRIPTION: These criteria include: The patient is eating (either orally or via tube) and/or has been taking other orally administered medications for a least 24 hours The patient has no evidence of active gastrointestinal bleeding or impaired GI absorption (gastrectomy, short bowel, patient on TNA or NPO).  If you have questions about this conversion, please contact the Pharmacy Department   Barrie Folk, [facility] 04/03/2023 8:49 AM

## 2023-04-04 LAB — GLUCOSE, CAPILLARY
Glucose-Capillary: 89 mg/dL (ref 70–99)
Glucose-Capillary: 89 mg/dL (ref 70–99)

## 2023-04-04 MED ORDER — OXYCODONE HCL 5 MG PO TABS: 5.0000 mg | ORAL_TABLET | Freq: Four times a day (QID) | ORAL | 0 refills | Status: DC | PRN

## 2023-04-04 MED ORDER — APIXABAN 5 MG PO TABS: 5.0000 mg | ORAL_TABLET | Freq: Two times a day (BID) | ORAL | 2 refills | Status: DC

## 2023-04-04 MED ORDER — ONDANSETRON 4 MG PO TBDP: 4.0000 mg | ORAL_TABLET | Freq: Four times a day (QID) | ORAL | 0 refills | Status: DC | PRN

## 2023-04-04 MED ORDER — TAMSULOSIN HCL 0.4 MG PO CAPS: 0.4000 mg | ORAL_CAPSULE | Freq: Every day | ORAL | 2 refills | Status: DC

## 2023-04-04 NOTE — Discharge Instructions (Signed)
In addition to included general post-operative instructions,  Diet: Resume home diet.   Activity: No heavy lifting >20 pounds (children, pets, laundry, garbage) or strenuous activity for 6 weeks, but light activity and walking are encouraged. Do not drive or drink alcohol if taking narcotic pain medications or having pain that might distract from driving. Okay to work with physical therapy   Wound care: Staples removed prior to discharge. Wound covered with steri-strips. These will stay in place for 7-10 days and fall off on their own typically. Okay to shower. No baths, no lotions or creams.   Medications:  Started on 5 mg Eliquis twice daily (anticoagulation) for right upper extremity DVT. You will continue this for 3 months  Started on Tamsulosin as well, once daily to help with urination. Recommend following up with PCP   Resume all home medications. For mild to moderate pain: acetaminophen (Tylenol) or ibuprofen/naproxen (if no kidney disease). Combining Tylenol with alcohol can substantially increase your risk of causing liver disease. Narcotic pain medications, if prescribed, can be used for severe pain, though may cause nausea, constipation, and drowsiness. Do not combine Tylenol and Percocet (or similar) within a 6 hour period as Percocet (and similar) contain(s) Tylenol. If you do not need the narcotic pain medication, you do not need to fill the prescription.  Call office [phone] / [phone]) at any time if any questions, worsening pain, fevers/chills, bleeding, drainage from incision site, or other concerns.

## 2023-04-04 NOTE — Plan of Care (Signed)
  Problem: Activity: Goal: Risk for activity intolerance will decrease Outcome: Progressing   Problem: Nutrition: Goal: Adequate nutrition will be maintained Outcome: Progressing   Problem: Safety: Goal: Ability to remain free from injury will improve Outcome: Progressing   Problem: Pain Managment: Goal: General experience of comfort will improve Outcome: Progressing

## 2023-04-04 NOTE — Discharge Summary (Signed)
[facility] SURGICAL DISCHARGE SUMMARY  Patient ID: Don Arias MRN: [id number] DOB/AGE: 04-19-1930 89 y.o.  Admit date: 03/18/2023 Discharge date: 04/04/2023  Discharge Diagnoses Patient Active Problem List   Diagnosis Date Noted   Colon perforation (HCC) 03/19/2023   Pneumoperitoneum 03/18/2023    Consultants None  Procedures 03/18/2023 Exploratory Laparotomy Right colectomy with ileocolonic anastomosis    HPI: Don Arias is a 89 y.o. male presenting with a 1.5 week history of right sided abdominal pain which has progressively getting worse.  He was concerned that he may have pulled a muscle and was also worried as the pain worsened that he may have appendicitis based on the location of his pain mostly in right lower quadrant.  Denies any nausea or vomiting and is having flatus still.  However, he has had issues with constipation, particularly the last two years.  He was trying stool softeners at first, but has ben getting worse.  Denies any blood in his stool, denies any fatigue or weakness, denies any weight loss.  He is very functional and lives independently and mows his lawn.  He has never had a colonoscopy.   In the ED, his workup showed normal WBC of 9.9 with hemoglobin 13.3.  His creatinine is 1.06 which is at his baseline with mild dehydration with an elevated BUN of 25 and low potassium of 3.3.  His vital signs were all normal without any tachycardia or hypotension or fevers.  He did have a CT scan of the abdomen pelvis which showed pneumoperitoneum with an area of narrowing in the mid ascending colon resulting in a large bowel obstruction of the cecum with a very distended cecum measuring up to 10 cm.  Small bowel is normal in caliber consistent with a functioning ileocecal valve.   Hospital Course: Informed consent was obtained and documented, and patient underwent exploratory laparotomy and right hemicolectomy (Dr Aleen Campi, 03/18/2023).   Post-operatively, patient did reasonably well but did develop ileus. He did require PICC placement and TPN was initiated on 07/31. He did develop leukocytosis as well without clear source. He underwent CT Abdomen/Pelvis on 08/05 without acute findings. His pathology also resulted and positive for malignancy. CEA was checked and found to be normal at 1.4. He continued to fluctuate his WBC and repeat CT Abdomen/Pelvis with CT Chest was obtained on 08/09 and also reassuring. He ultimately had return of bowel function and NGT was removed. Diet advanced without incident although continued to complain of decreased appetite. On 08/12 he was found to hve swelling in his RUE. [place] did show PICC associated DVT. This was removed and he was started on Eliquis. MRI Liver also obtained due to finding of liver mass in setting of colon CA although this was indeterminate. Plan for oncology referral as outpatient. The remainder of patient's hospital course was essentially unremarkable, and discharge planning was initiated accordingly with patient safely able to be discharged home with appropriate discharge instructions, pain control, and outpatient follow-up after all of his (and his daughter's) questions were answered to their expressed satisfaction.   Discharge Condition: Good    Physical Examination:  Constitutional: alert, cooperative and no distress  Respiratory: breathing non-labored at rest  Cardiovascular: regular rate and sinus rhythm  Gastrointestinal: Soft, incisional soreness, non-distended, no rebound/guarding. Surgical drain in LLQ; output serous (REMOVED) Integumentary: Laparotomy is intact with staples (REMOVED), penrose in place (REMOVED), no significant erythema, drainage from inferior aspect more serous.  MSK: RUE edema improving, no pitting edema, 2+ pulses  in RUE distally     Allergies as of 04/04/2023       Reactions   Nitroglycerin Other (See Comments)   Cardiac Arrest   Gabapentin    Other  Reaction(s): Coordination problem   Hydralazine Hcl    Other Reaction(s): Unknown   Lisinopril Cough   Other reaction(s): Cough   Pregabalin    Other Reaction(s): Dizziness Other reaction(s): Dizziness   Tramadol    Other Reaction(s): Other (See Comments), Unknown   Valsartan    Other Reaction(s): Cough Other reaction(s): Cough   Verapamil    Other Reaction(s): Dizziness Other reaction(s): Dizziness   Guaifenesin Rash   Sulfa Antibiotics Rash        Medication List     TAKE these medications    apixaban 5 MG Tabs tablet Commonly known as: ELIQUIS Take 1 tablet (5 mg total) by mouth 2 (two) times daily.   ondansetron 4 MG disintegrating tablet Commonly known as: ZOFRAN-ODT Take 1 tablet (4 mg total) by mouth every 6 (six) hours as needed for nausea or vomiting.   oxyCODONE 5 MG immediate release tablet Commonly known as: Oxy IR/ROXICODONE Take 1 tablet (5 mg total) by mouth every 6 (six) hours as needed for severe pain or breakthrough pain.   tamsulosin 0.4 MG Caps capsule Commonly known as: FLOMAX Take 1 capsule (0.4 mg total) by mouth daily.          Contact information for follow-up providers     [facility]. Schedule an appointment as soon as possible for a visit in 1 week(s).   Why: New diagnosis of right colon cancer s/p surgery        Henrene Dodge, MD. Go on 04/19/2023.   Specialty: General Surgery Why: Go to appointment on 08/28 at 145 PM Contact information: [address] [place] [address] [phone]              Contact information for after-discharge care     Destination     [facility] Preferred SNF .   Service: Skilled Nursing Contact information: [address] [place] [address] [phone]                      Time spent on discharge management including discussion of hospital course, clinical condition, outpatient  instructions, prescriptions, and follow up with the patient and members of the medical team: >30 minutes  -- [name] , PA-C [facility]  04/04/2023, 10:00 AM [phone] M-F: 7am - 4pm

## 2023-04-04 NOTE — Plan of Care (Signed)
  Problem: Education: Goal: Knowledge of General Education information will improve Description: Including pain rating scale, medication(s)/side effects and non-pharmacologic comfort measures Outcome: Adequate for Discharge   Problem: Health Behavior/Discharge Planning: Goal: Ability to manage health-related needs will improve Outcome: Adequate for Discharge   Problem: Clinical Measurements: Goal: Ability to maintain clinical measurements within normal limits will improve Outcome: Adequate for Discharge Goal: Will remain free from infection Outcome: Adequate for Discharge Goal: Diagnostic test results will improve Outcome: Adequate for Discharge Goal: Respiratory complications will improve Outcome: Adequate for Discharge Goal: Cardiovascular complication will be avoided Outcome: Adequate for Discharge   Problem: Activity: Goal: Risk for activity intolerance will decrease Outcome: Adequate for Discharge   Problem: Nutrition: Goal: Adequate nutrition will be maintained Outcome: Adequate for Discharge   Problem: Coping: Goal: Level of anxiety will decrease Outcome: Adequate for Discharge   Problem: Elimination: Goal: Will not experience complications related to bowel motility Outcome: Adequate for Discharge Goal: Will not experience complications related to urinary retention Outcome: Adequate for Discharge   Problem: Pain Managment: Goal: General experience of comfort will improve Outcome: Adequate for Discharge   Problem: Safety: Goal: Ability to remain free from injury will improve Outcome: Adequate for Discharge   Problem: Skin Integrity: Goal: Risk for impaired skin integrity will decrease Outcome: Adequate for Discharge   Pt ao x4 but forgetful, respirations even and unlabored on RA. Pt family has all belongings. Transport team  has received all DC paperwork. [facility] called [facility].

## 2023-04-04 NOTE — TOC Transition Note (Signed)
Transition of Care [facility]) - CM/SW Discharge Note   Patient Details  Name: ANARI TINSON MRN: [id number] Date of Birth: 23-Mar-1930  [facility] CM/SW Contact:  Chapman Fitch, RN Phone Number: 04/04/2023, 11:54 AM   Clinical Narrative:      Patient will DC [id number] Anticipated DC date: 04/04/23  Family notified: Daughter Eunice Blase, and [name] notified Transport by: Wendie Simmer  Per MD patient ready for DC to . RN, patient, patient's family, and facility notified of DC. Discharge Summary sent to facility. RN given number for report. DC packet on chart. Ambulance transport requested for patient.  TOC signing off.    Barriers to Discharge: Continued Medical Work up   Patient Goals and CMS Choice CMS Medicare.gov Compare Post Acute Care list provided to:: Patient (Daughter at bedside)    Discharge Placement                         Discharge Plan and Services Additional resources added to the After Visit Summary for       Post Acute Care Choice:  (TBD)                               Social Determinants of Health (SDOH) Interventions SDOH Screenings   Food Insecurity: No Food Insecurity (03/19/2023)  Housing: Low Risk  (03/19/2023)  Transportation Needs: No Transportation Needs (03/19/2023)  Utilities: Not At Risk (03/19/2023)  Tobacco Use: Medium Risk (03/18/2023)     Readmission Risk Interventions     No data to display

## 2023-04-10 ENCOUNTER — Other Ambulatory Visit: Payer: Self-pay

## 2023-04-10 DIAGNOSIS — C182 Malignant neoplasm of ascending colon: Secondary | ICD-10-CM

## 2023-04-10 DIAGNOSIS — Z86718 Personal history of other venous thrombosis and embolism: Secondary | ICD-10-CM

## 2023-04-13 ENCOUNTER — Inpatient Hospital Stay: Payer: Medicare Other

## 2023-04-13 ENCOUNTER — Inpatient Hospital Stay: Payer: Medicare Other | Attending: Oncology | Admitting: Oncology

## 2023-04-13 ENCOUNTER — Encounter: Payer: Self-pay | Admitting: Oncology

## 2023-04-13 VITALS — BP 123/81 | HR 82 | Temp 97.7°F | Resp 20 | Ht 70.0 in | Wt 180.2 lb

## 2023-04-13 DIAGNOSIS — F419 Anxiety disorder, unspecified: Secondary | ICD-10-CM | POA: Insufficient documentation

## 2023-04-13 DIAGNOSIS — D649 Anemia, unspecified: Secondary | ICD-10-CM | POA: Diagnosis not present

## 2023-04-13 DIAGNOSIS — R5383 Other fatigue: Secondary | ICD-10-CM | POA: Diagnosis not present

## 2023-04-13 DIAGNOSIS — R531 Weakness: Secondary | ICD-10-CM | POA: Diagnosis not present

## 2023-04-13 DIAGNOSIS — I712 Thoracic aortic aneurysm, without rupture, unspecified: Secondary | ICD-10-CM | POA: Insufficient documentation

## 2023-04-13 DIAGNOSIS — H8110 Benign paroxysmal vertigo, unspecified ear: Secondary | ICD-10-CM | POA: Insufficient documentation

## 2023-04-13 DIAGNOSIS — C182 Malignant neoplasm of ascending colon: Secondary | ICD-10-CM | POA: Insufficient documentation

## 2023-04-13 DIAGNOSIS — C189 Malignant neoplasm of colon, unspecified: Secondary | ICD-10-CM

## 2023-04-13 DIAGNOSIS — R269 Unspecified abnormalities of gait and mobility: Secondary | ICD-10-CM | POA: Insufficient documentation

## 2023-04-14 ENCOUNTER — Ambulatory Visit: Payer: Medicare Other | Admitting: Surgery

## 2023-04-14 ENCOUNTER — Encounter: Payer: Self-pay | Admitting: Surgery

## 2023-04-14 VITALS — BP 113/78 | HR 86 | Temp 98.0°F | Ht 70.0 in | Wt 180.0 lb

## 2023-04-14 DIAGNOSIS — C189 Malignant neoplasm of colon, unspecified: Secondary | ICD-10-CM | POA: Insufficient documentation

## 2023-04-14 DIAGNOSIS — C182 Malignant neoplasm of ascending colon: Secondary | ICD-10-CM

## 2023-04-14 DIAGNOSIS — Z08 Encounter for follow-up examination after completed treatment for malignant neoplasm: Secondary | ICD-10-CM

## 2023-04-14 DIAGNOSIS — Z86718 Personal history of other venous thrombosis and embolism: Secondary | ICD-10-CM

## 2023-04-14 DIAGNOSIS — Z09 Encounter for follow-up examination after completed treatment for conditions other than malignant neoplasm: Secondary | ICD-10-CM

## 2023-04-14 NOTE — Progress Notes (Signed)
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  INTERVAL HISTORY: Patient is a 89 year old male who recently underwent surgical resection for the above-stated malignancy and was noted to have perforation.  He had an extended hospital stay and continues to be in rehab, but is improving.  He continues to have weakness and fatigue, but otherwise feels well.  He has no neurologic complaints.  He denies any recent fevers.  He has a good appetite and is gaining weight.  He has no chest pain, shortness of breath, cough, or hemoptysis.  He denies any nausea, vomiting, constipation, or new.  He has no abdominal pain.  He denies any melena or hematochezia.  He has no urinary complaints.  Patient offers no further specific complaints today.  REVIEW OF SYSTEMS:   Review of Systems  Constitutional:  Positive for malaise/fatigue. Negative for fever and weight loss.  Respiratory: Negative.  Negative for cough, hemoptysis and shortness of breath.   Cardiovascular: Negative.  Negative for chest pain and leg swelling.  Gastrointestinal: Negative.  Negative for abdominal pain, blood in stool, constipation, diarrhea, melena, nausea and vomiting.  Genitourinary: Negative.  Negative for dysuria.  Musculoskeletal: Negative.  Negative for back pain.  Skin: Negative.  Negative for rash.  Neurological:  Positive for weakness. Negative for dizziness, focal weakness and headaches.  Psychiatric/Behavioral: Negative.  The patient is not nervous/anxious.     As per HPI. Otherwise, a complete review of systems is negative.  PAST MEDICAL HISTORY: Past Medical  History:  Diagnosis Date   Cancer (HCC)    skin   Hypertension     PAST SURGICAL HISTORY: Past Surgical History:  Procedure Laterality Date   CATARACT EXTRACTION     LAPAROTOMY N/A 03/18/2023   Procedure: EXPLORATORY LAPAROTOMY  POSS. RIGHT COLECTOMY;  Surgeon: Henrene Dodge, MD;  Location: [facility] ORS;  Service: General;  Laterality: N/A;   PROSTATE SURGERY     RECONSTRUCTION OF EYELID     SKIN CANCER EXCISION      FAMILY HISTORY: Family History  Problem Relation Age of Onset   Cancer Mother        Lung    ADVANCED DIRECTIVES (Y/N):  N  HEALTH MAINTENANCE: Social History   Tobacco Use   Smoking status: Former    Types: Cigarettes   Smokeless tobacco: Former  Building services engineer status: Never Used  Substance Use Topics   Alcohol use: Not Currently   Drug use: Never     Colonoscopy:  PAP:  Bone density:  Lipid panel:  Allergies  Allergen Reactions   Nitroglycerin Other (See Comments)    Cardiac Arrest   Gabapentin     Other Reaction(s): Coordination problem   Hydralazine Hcl     Other Reaction(s): Unknown   Lisinopril Cough    Other reaction(s): Cough   Pregabalin     Other Reaction(s): Dizziness  Other reaction(s): Dizziness   Tramadol     Other Reaction(s): Other (See Comments), Unknown   Valsartan     Other Reaction(s): Cough  Other reaction(s): Cough   Verapamil     Other Reaction(s): Dizziness  Other reaction(s): Dizziness   Guaifenesin Rash   Sulfa Antibiotics Rash  Current Outpatient Medications  Medication Sig Dispense Refill   Amino Acids-Protein Hydrolys (PRO-STAT RENAL CARE PO) Take 8.6 tablets by mouth 2 (two) times daily.     apixaban (ELIQUIS) 5 MG TABS tablet Take 1 tablet (5 mg total) by mouth 2 (two) times daily. 60 tablet 2   ascorbic acid (VITAMIN C) 500 MG tablet Take 500 mg by mouth 2 (two) times daily.     buprenorphine-naloxone (SUBOXONE) 2-0.5 mg SUBL SL tablet Place 1 tablet under the tongue.     magnesium hydroxide  (MILK OF MAGNESIA) 400 MG/5ML suspension Take 30 mLs by mouth daily.     ondansetron (ZOFRAN-ODT) 4 MG disintegrating tablet Take 1 tablet (4 mg total) by mouth every 6 (six) hours as needed for nausea or vomiting. 20 tablet 0   oxyCODONE (OXY IR/ROXICODONE) 5 MG immediate release tablet Take 1 tablet (5 mg total) by mouth every 6 (six) hours as needed for severe pain or breakthrough pain. 20 tablet 0   polyethylene glycol (MIRALAX / GLYCOLAX) 17 g packet Take 17 g by mouth daily.     tamsulosin (FLOMAX) 0.4 MG CAPS capsule Take 1 capsule (0.4 mg total) by mouth daily. 30 capsule 2   zinc gluconate 50 MG tablet Take 50 mg by mouth daily.     No current facility-administered medications for this visit.    OBJECTIVE: Vitals:   04/13/23 1103  BP: 123/81  Pulse: 82  Resp: 20  Temp: 97.7 F (36.5 C)  SpO2: 97%     Body mass index is 25.86 kg/m.    ECOG FS:1 - Symptomatic but completely ambulatory  General: Well-developed, well-nourished, no acute distress. Eyes: Pink conjunctiva, anicteric sclera. HEENT: Normocephalic, moist mucous membranes. Lungs: No audible wheezing or coughing. Heart: Regular rate and rhythm. Abdomen: Soft, nontender, no obvious distention. Musculoskeletal: No edema, cyanosis, or clubbing. Neuro: Alert, answering all questions appropriately. Cranial nerves grossly intact. Skin: No rashes or petechiae noted. Psych: Normal affect. Lymphatics: No cervical, calvicular, axillary or inguinal LAD.   LAB RESULTS:  Lab Results  Component Value Date   NA 137 04/03/2023   K 3.8 04/03/2023   CL 106 04/03/2023   CO2 24 04/03/2023   GLUCOSE 119 (H) 04/03/2023   BUN 19 04/03/2023   CREATININE 0.91 04/03/2023   CALCIUM 8.6 (L) 04/03/2023   PROT 4.8 (L) 04/03/2023   ALBUMIN 2.0 (L) 04/03/2023   AST 21 04/03/2023   ALT 19 04/03/2023   ALKPHOS 95 04/03/2023   BILITOT 0.6 04/03/2023   GFRNONAA >60 04/03/2023   GFRAA >60 07/17/2013    Lab Results  Component Value  Date   WBC 8.5 04/03/2023   NEUTROABS 6.0 07/17/2013   HGB 10.0 (L) 04/03/2023   HCT 30.6 (L) 04/03/2023   MCV 90.0 04/03/2023   PLT 248 04/03/2023     STUDIES: MR LIVER W WO CONTRAST  Result Date: 04/03/2023 CLINICAL DATA:  History of colon cancer. Evaluate liver lesion identified on recent CT. EXAM: MRI ABDOMEN WITHOUT AND WITH CONTRAST TECHNIQUE: Multiplanar multisequence MR imaging of the abdomen was performed both before and after the administration of intravenous contrast. CONTRAST:  8mL GADAVIST GADOBUTROL 1 MMOL/ML IV SOLN COMPARISON:  CT AP from 03/18/2023 FINDINGS: Lower chest: There is a small amount of pleural fluid noted bilaterally overlying the lung bases. Hepatobiliary: Corresponding to the CT abnormality is a well-circumscribed, T2 hyperintense, T1 hypointense lesion within posterior right lobe of liver which measures 2.2 by 1.5 cm, image 12/5. On the arterial phase  images there is no significant internal enhancement within this structure. On the portal venous phase images there is mild peripheral enhancement which appears similar on the 60 second delayed images through the 3 minute delayed images., image 24/21. No additional focal liver abnormality. Moderate distension of the gallbladder no gallstones or gallbladder wall thickening. No bile duct dilatation identified. Pancreas: No mass, inflammatory changes, or other parenchymal abnormality identified. Spleen:  Within normal limits in size and appearance. Adrenals/Urinary Tract: No masses identified. No evidence of hydronephrosis. Upper pole left kidney scarring identified. Stomach/Bowel: Small hiatal hernia. Postop change from recent right hemicolectomy with enterocolonic anastomosis. No pathologic dilatation of the bowel loops. Vascular/Lymphatic: Infrarenal abdominal aortic aneurysm is again noted just above the bifurcation. This measures 3.9 cm, image 86/18. No abdominal adenopathy. Other: No focal fluid collections identified.  Stranding within the peritoneal fat is identified which likely reflects postoperative change. There is edema extending along bilateral flanks. Musculoskeletal: No suspicious bone lesions identified. IMPRESSION: 1. Corresponding to the CT abnormality is a well-circumscribed, T2 hyperintense, T1 hypointense lesion within the posterior right lobe of liver which measures 2.2 x 1.5 cm. There is mild peripheral enhancement on the portal venous phase images which appears similar on the 3 minute delayed images. This is indeterminate. Diagnosis of exclusion would be liver metastasis. Differential considerations include atypical hemangioma. Consider further evaluation with PET-CT. If hypermetabolic findings would be strongly suggestive of metastatic disease. Alternatively, short-term follow-up with repeat CT or MRI in 3 months would be advised to assess for temporal change in the appearance of this lesion. 2. Postop change from recent right hemicolectomy with enterocolonic anastomosis. 3. Small hiatal hernia. 4. Infrarenal abdominal aortic aneurysm measures 3.9 cm. Recommend follow-up ultrasound every 2 years. 5. Small amount of pleural fluid noted bilaterally overlying the lung bases. Electronically Signed   By: Signa Kell M.D.   On: 04/03/2023 16:48   US Venous Img Upper Uni Right(DVT)  Result Date: 04/03/2023 CLINICAL DATA:  Right upper extremity swelling EXAM: RIGHT UPPER EXTREMITY VENOUS DOPPLER ULTRASOUND TECHNIQUE: Gray-scale sonography with graded compression, as well as color Doppler and duplex ultrasound were performed to evaluate the upper extremity deep venous system from the level of the subclavian vein and including the jugular, axillary, basilic, radial, ulnar and upper cephalic vein. Spectral Doppler was utilized to evaluate flow at rest and with distal augmentation maneuvers. COMPARISON:  None Available. FINDINGS: Contralateral Subclavian Vein: Respiratory phasicity is normal and symmetric with the  symptomatic side. No evidence of thrombus. Normal compressibility. Internal Jugular Vein: No evidence of thrombus. Normal compressibility, respiratory phasicity and response to augmentation. Subclavian Vein: No evidence of thrombus. Normal compressibility, respiratory phasicity and response to augmentation. Axillary Vein: Nonocclusive thrombus associated with PICC line. Cephalic Vein: No evidence of thrombus. Normal compressibility, respiratory phasicity and response to augmentation. Basilic Vein: Nonocclusive thrombus of the proximal midportion associated with PICC line. Brachial Veins: No evidence of thrombus. Normal compressibility, respiratory phasicity and response to augmentation. Radial Veins: No evidence of thrombus. Normal compressibility, respiratory phasicity and response to augmentation. Ulnar Veins: No evidence of thrombus. Normal compressibility, respiratory phasicity and response to augmentation. Venous Reflux:  None visualized. Other Findings:  Soft tissue edema. IMPRESSION: Nonocclusive DVT of the right axillary associated with PICC line. Thrombus also seen in the basilic vein associated with the PICC line. These results will be called to the ordering clinician or representative by the Radiologist Assistant, and communication documented in the PACS or [employer]. Electronically Signed   By: Tacey Ruiz  Strickland M.D.   On: 04/03/2023 11:24   CT ABDOMEN PELVIS W CONTRAST  Result Date: 03/31/2023 CLINICAL DATA:  Postoperative abdominal pain. Exploratory laparotomy and right hemicolectomy for right colon perforation on 03/18/2023. EXAM: CT CHEST, ABDOMEN, AND PELVIS WITH CONTRAST TECHNIQUE: Multidetector CT imaging of the chest, abdomen and pelvis was performed following the standard protocol during bolus administration of intravenous contrast. RADIATION DOSE REDUCTION: This exam was performed according to the departmental dose-optimization program which includes automated exposure control,  adjustment of the mA and/or kV according to patient size and/or use of iterative reconstruction technique. CONTRAST:  OMNIPAQUE IOHEXOL 300 MG/ML  SOLN COMPARISON:  CT abdomen and pelvis 03/27/2023. CT chest abdomen and pelvis 11/30/2009. FINDINGS: CT CHEST FINDINGS Cardiovascular: Ascending aortic aneurysm measures 4.3 cm, unchanged. No evidence for dissection. There is moderate calcified atherosclerotic disease throughout the aorta. The heart is mildly enlarged. There is no pericardial effusion. Right-sided central venous catheter ends in the distal SVC. Mediastinum/Nodes: No enlarged mediastinal, hilar, or axillary lymph nodes. Thyroid gland, trachea, and esophagus demonstrate no significant findings. There is a small hiatal hernia. Lungs/Pleura: There is a trace left pleural effusion similar to the prior study. There some scattered reticular opacities in the right mid and lower lung, new from prior. There is no lung consolidation or pneumothorax. Musculoskeletal: No chest wall mass or suspicious bone lesions identified. CT ABDOMEN PELVIS FINDINGS Hepatobiliary: Indeterminate rounded hypodense lesion in the dome of the liver measures 18 mm, unchanged. Gallbladder and bile ducts are within normal limits. Pancreas: There scattered pancreatic head calcifications likely related to chronic pancreatitis. No acute inflammation or peripancreatic fluid collection. Spleen: Normal in size without focal abnormality. Adrenals/Urinary Tract: The bladder is decompressed by Foley catheter. There is no hydronephrosis or perinephric stranding. There is mild left renal atrophy and cortical scarring, unchanged. Adrenal glands are within normal limits. Stomach/Bowel: Patient is status post right hemicolectomy with direct anastomosis. Anastomotic site appears intact. There is no significant surrounding inflammation or fluid collection in this region. There is no free air. There is no evidence for bowel obstruction. There is  sigmoid colon diverticulosis without evidence for diverticulitis. Vascular/Lymphatic: Infrarenal abdominal aortic aneurysm is again seen measuring 4.1 by 3.9 cm, unchanged from prior. IVC normal in size. Portal veins grossly patent. No enlarged lymph nodes are identified. Reproductive: Prostate gland is markedly enlarged, unchanged. Other: Left lower quadrant percutaneous surgical drain is in place with tip ending in the lateral right abdomen, unchanged. There is mild stranding in the right mid abdomen mesentery, similar to prior. There is a tiny enhancing fluid collection adjacent to the anterior abdominal wall on the right measuring 9 x 9 by 10 mm image 509/79. No other enhancing fluid collections are identified. Midline abdominal skin staples are present. Musculoskeletal: No acute or significant osseous findings. IMPRESSION: 1. Status post right hemicolectomy. Anastomotic site appears intact. There is no significant surrounding inflammation or fluid collection in this region. 2. Single small enhancing fluid collection adjacent to the anterior abdominal wall on the right measuring 9 x 9 x 10 mm, indeterminate. Abscess not excluded. 3. Percutaneous drainage catheter is unchanged in position. 4. Stable trace left pleural effusion. 5. New scattered reticular opacities in the right mid and lower lung, likely infectious/inflammatory. 6. Stable ascending thoracic aortic aneurysm measuring 4.3 cm. Recommend annual imaging followup by CTA or MRA. This recommendation follows 2010 ACCF/AHA/AATS/ACR/ASA/SCA/SCAI/SIR/STS/SVM Guidelines for the Diagnosis and Management of Patients with Thoracic Aortic Disease. Circulation. 2010; 121: M841-L244. Aortic aneurysm NOS (ICD10-I71.9) 7.  Stable infrarenal abdominal aortic aneurysm measuring 4.1 cm. Recommend follow-up every 12 months and vascular consultation. This recommendation follows ACR consensus guidelines: White Paper of the ACR Incidental Findings Committee II on Vascular  Findings. J Am Coll Radiol 2013; 10:789-794. 8. Stable indeterminate hypodense lesion in the dome of the liver. Metastasis not excluded. 9. Aortic atherosclerosis. Aortic Atherosclerosis (ICD10-I70.0). Electronically Signed   By: Darliss Cheney M.D.   On: 03/31/2023 15:49   CT CHEST W CONTRAST  Result Date: 03/31/2023 CLINICAL DATA:  Postoperative abdominal pain. Exploratory laparotomy and right hemicolectomy for right colon perforation on 03/18/2023. EXAM: CT CHEST, ABDOMEN, AND PELVIS WITH CONTRAST TECHNIQUE: Multidetector CT imaging of the chest, abdomen and pelvis was performed following the standard protocol during bolus administration of intravenous contrast. RADIATION DOSE REDUCTION: This exam was performed according to the departmental dose-optimization program which includes automated exposure control, adjustment of the mA and/or kV according to patient size and/or use of iterative reconstruction technique. CONTRAST:  OMNIPAQUE IOHEXOL 300 MG/ML  SOLN COMPARISON:  CT abdomen and pelvis 03/27/2023. CT chest abdomen and pelvis 11/30/2009. FINDINGS: CT CHEST FINDINGS Cardiovascular: Ascending aortic aneurysm measures 4.3 cm, unchanged. No evidence for dissection. There is moderate calcified atherosclerotic disease throughout the aorta. The heart is mildly enlarged. There is no pericardial effusion. Right-sided central venous catheter ends in the distal SVC. Mediastinum/Nodes: No enlarged mediastinal, hilar, or axillary lymph nodes. Thyroid gland, trachea, and esophagus demonstrate no significant findings. There is a small hiatal hernia. Lungs/Pleura: There is a trace left pleural effusion similar to the prior study. There some scattered reticular opacities in the right mid and lower lung, new from prior. There is no lung consolidation or pneumothorax. Musculoskeletal: No chest wall mass or suspicious bone lesions identified. CT ABDOMEN PELVIS FINDINGS Hepatobiliary: Indeterminate rounded hypodense  lesion in the dome of the liver measures 18 mm, unchanged. Gallbladder and bile ducts are within normal limits. Pancreas: There scattered pancreatic head calcifications likely related to chronic pancreatitis. No acute inflammation or peripancreatic fluid collection. Spleen: Normal in size without focal abnormality. Adrenals/Urinary Tract: The bladder is decompressed by Foley catheter. There is no hydronephrosis or perinephric stranding. There is mild left renal atrophy and cortical scarring, unchanged. Adrenal glands are within normal limits. Stomach/Bowel: Patient is status post right hemicolectomy with direct anastomosis. Anastomotic site appears intact. There is no significant surrounding inflammation or fluid collection in this region. There is no free air. There is no evidence for bowel obstruction. There is sigmoid colon diverticulosis without evidence for diverticulitis. Vascular/Lymphatic: Infrarenal abdominal aortic aneurysm is again seen measuring 4.1 by 3.9 cm, unchanged from prior. IVC normal in size. Portal veins grossly patent. No enlarged lymph nodes are identified. Reproductive: Prostate gland is markedly enlarged, unchanged. Other: Left lower quadrant percutaneous surgical drain is in place with tip ending in the lateral right abdomen, unchanged. There is mild stranding in the right mid abdomen mesentery, similar to prior. There is a tiny enhancing fluid collection adjacent to the anterior abdominal wall on the right measuring 9 x 9 by 10 mm image 509/79. No other enhancing fluid collections are identified. Midline abdominal skin staples are present. Musculoskeletal: No acute or significant osseous findings. IMPRESSION: 1. Status post right hemicolectomy. Anastomotic site appears intact. There is no significant surrounding inflammation or fluid collection in this region. 2. Single small enhancing fluid collection adjacent to the anterior abdominal wall on the right measuring 9 x 9 x 10 mm,  indeterminate. Abscess not excluded. 3. Percutaneous drainage catheter  is unchanged in position. 4. Stable trace left pleural effusion. 5. New scattered reticular opacities in the right mid and lower lung, likely infectious/inflammatory. 6. Stable ascending thoracic aortic aneurysm measuring 4.3 cm. Recommend annual imaging followup by CTA or MRA. This recommendation follows 2010 ACCF/AHA/AATS/ACR/ASA/SCA/SCAI/SIR/STS/SVM Guidelines for the Diagnosis and Management of Patients with Thoracic Aortic Disease. Circulation. 2010; 121: Z610-R604. Aortic aneurysm NOS (ICD10-I71.9) 7. Stable infrarenal abdominal aortic aneurysm measuring 4.1 cm. Recommend follow-up every 12 months and vascular consultation. This recommendation follows ACR consensus guidelines: White Paper of the ACR Incidental Findings Committee II on Vascular Findings. J Am Coll Radiol 2013; 10:789-794. 8. Stable indeterminate hypodense lesion in the dome of the liver. Metastasis not excluded. 9. Aortic atherosclerosis. Aortic Atherosclerosis (ICD10-I70.0). Electronically Signed   By: Darliss Cheney M.D.   On: 03/31/2023 15:49   CT ABDOMEN PELVIS W CONTRAST  Result Date: 03/27/2023 CLINICAL DATA:  Postop partial right-sided colonic resection for neoplasm and perforation with persistent elevated white blood cell count. EXAM: CT ABDOMEN AND PELVIS WITH CONTRAST TECHNIQUE: Multidetector CT imaging of the abdomen and pelvis was performed using the standard protocol following bolus administration of intravenous contrast. RADIATION DOSE REDUCTION: This exam was performed according to the departmental dose-optimization program which includes automated exposure control, adjustment of the mA and/or kV according to patient size and/or use of iterative reconstruction technique. CONTRAST:  OMNIPAQUE IOHEXOL 300 MG/ML  SOLN COMPARISON:  Preoperative CT 03/18/2023 FINDINGS: Lower chest: Tiny pleural effusions, left-greater-than-right with some adjacent opacity.  Coronary artery calcifications are seen. Calcifications in the area of the aortic valve. Hepatobiliary: Once again there is a lesion seen in the dome of the liver in segment 7 measuring 2.6 x 2.2 cm. Indeterminate focus. In light of the other findings of metastatic lesion is again in the differential. Patent portal vein. Gallbladder is nondilated. Pancreas: Mild atrophy of the pancreas. Once again there are some punctate calcifications along the pancreas. No obvious mass. Spleen: Normal in size without focal abnormality. Adrenals/Urinary Tract: The adrenal glands are preserved. There is moderate atrophy of the left kidney with some focal areas of atrophy along the upper pole laterally. No enhancing left-sided renal mass or collecting system dilatation. Once again there is a stone in the bladder at the level of the left UVJ measuring 12 mm. There is air in the urinary bladder which could relate to previous intervention. No right-sided renal mass or collecting system dilatation. The right ureter has a normal course and caliber down to the bladder. Stomach/Bowel: There has been interval partial right hemicolectomy with primary anastomosis. Oral contrast was administered and extends along the nondilated stomach, small bowel and across the anastomosis into the colon. Oral contrast seen as far as the mid descending colon. No extravasation of oral contrast. There is sigmoid colon diverticulosis with circular muscle hypertrophy and slight stranding. In principle a component of diverticulitis is not excluded. If this has true, there are no complex features of obstruction or abscess formation. Vascular/Lymphatic: Normal caliber IVC. Scattered vascular calcifications. Once again there is an infrarenal abdominal aortic aneurysm measuring 4.2 x 4.1 cm. No discrete lymph node enlargement identified. The prominent mesenteric lymph nodes on the previous are not as well seen on this examination today. Reproductive: Enlarged  prostate. Other: Once again surgical changes identified with scattered mesenteric stranding, few bubbles of air and minimal fluid. No discrete significant rim enhancing fluid collections identified. There is drain entering the left hemipelvis and coursing along the surgical margin to the right hemiabdomen.  Is also a separate drain in the subcutaneous fat in the midline just deep to the skin staples. No significant fluid collections adjacent to the course of the drains. Musculoskeletal: Mild curvature of the spine with some degenerative changes. Transitional lumbosacral segment. Anasarca. Critical Value/emergent results were called by telephone at the time of interpretation on 03/27/2023 at 2:46 pm to provider [facility] - [place] , who verbally acknowledged these results. IMPRESSION: Interval right hemicolectomy with primary anastomosis. Expected surgical changes with some stranding, ill-defined areas of mild fluid and air. No clear separate rim enhancing fluid collections identified at this time. No bowel obstruction. Drains in place. Sigmoid colon diverticulosis with slight stranding and wall thickening. In principle a subtle simple diverticulitis is not excluded. No complex features. Please correlate with clinical presentation. Tiny pleural effusions, left-greater-than-right with some adjacent opacity. Persistent dome liver lesion. Again a metastatic lesion is in the differential. Additional workup when clinically appropriate. 4.2 cm infrarenal abdominal aortic aneurysm. Recommend continued follow up surveillance. Recommend follow-up every 12 months and vascular consultation. This recommendation follows ACR consensus guidelines: White Paper of the ACR Incidental Findings Committee II on Vascular Findings. J Am Coll Radiol 2013; 10:789-794. Electronically Signed   By: Karen Kays M.D.   On: 03/27/2023 17:48   DG ABD ACUTE 2+V W 1V CHEST  Result Date: 03/26/2023 CLINICAL DATA:  Status post gastrointestinal surgery.  Follow-up ileus. EXAM: DG ABDOMEN ACUTE WITH 1 VIEW CHEST COMPARISON:  CT from 03/18/2023. FINDINGS: Small amount of pneumoperitoneum noted within the right upper quadrant of the abdomen, decreased from previous exam. Likely postoperative. Blunting of the left costophrenic angle noted which may reflect a small effusion. Postsurgical change with drainage catheter overlying the lower abdomen and pelvis. No pathologic dilatation of the small bowel loops. Gaseous distension scratch set there is mild gaseous distension noted involving the transverse colon, left colon and rectum. IMPRESSION: 1. Mild gaseous distension of the transverse colon, left colon and rectum. No pathologic dilatation of the small bowel loops. Imaging findings may reflect mild postoperative colonic ileus. 2. Small amount of pneumoperitoneum, decreased from previous exam. Likely postoperative. 3. Blunting of the left costophrenic angle which may reflect a small effusion. Electronically Signed   By: Signa Kell M.D.   On: 03/26/2023 10:04   [place] EKG SITE RITE  Result Date: 03/22/2023 If Site Rite image not attached, placement could not be confirmed due to current cardiac rhythm.  CT ABDOMEN PELVIS W CONTRAST  Result Date: 03/18/2023 CLINICAL DATA:  Right-sided abdominal pain and distention. EXAM: CT ABDOMEN AND PELVIS WITH CONTRAST TECHNIQUE: Multidetector CT imaging of the abdomen and pelvis was performed using the standard protocol following bolus administration of intravenous contrast. RADIATION DOSE REDUCTION: This exam was performed according to the departmental dose-optimization program which includes automated exposure control, adjustment of the mA and/or kV according to patient size and/or use of iterative reconstruction technique. CONTRAST:  OMNIPAQUE IOHEXOL 300 MG/ML  SOLN COMPARISON:  CT 2011 FINDINGS: Lower chest: Mild linear opacity lung bases likely scar or atelectasis. No pleural effusion. Coronary artery calcifications  are seen. Significant calcifications in the area of the aortic valve. Hepatobiliary: Low-attenuation segment 7 liver lesion seen measuring 2.6 by 2.2 cm. Gallbladder is nondilated. Patent portal vein. Pancreas: Mild atrophy of the pancreas. Several punctate calcifications are seen. Please correlate for any history of chronic calcific pancreatitis. Spleen: Normal in size without focal abnormality. Adrenals/Urinary Tract: Adrenal glands are preserved. Moderate atrophy of the left adrenal gland with a focal area towards  the upper pole laterally. Punctate nonobstructing lower pole left-sided renal stone. No left-sided renal collecting system dilatation. There is a 12 mm stone in the bladder along the left side near the margin of the UVJ. The bladder is underdistended with slight wall thickening. Right kidney is without enhancing mass or collecting system dilatation. The right ureter has a normal course and caliber down to the bladder. Stomach/Bowel: No oral contrast. Scattered stool. Sigmoid colon diverticula. There is dilatation of the cecum with diameter approaching 10 cm with significant luminal debris in stool. There is possible mass lesion along the ascending colon distal to the ileocecal valve as seen on axial image 50, coronal image 44. This has at the level of caliber change along the colon. A developing mass lesion or neoplasm is possible. Stomach and small bowel is nondilated. Vascular/Lymphatic: There is a infrarenal abdominal aortic aneurysm identified extending posterior. There is some wispy enhancement along the area of mural plaque and thrombus in the aneurysm which overall has diameter of 4.1 x 4.2 cm. This has centered just above the aortic bifurcation. Normal caliber IVC. No specific abnormal retroperitoneal lymph node enlargement however there are some prominent mesenteric nodes on the right side anteriorly such as axial series 2, image 42, 44. These measure up to 11 mm in size and has a rounded,  abnormal morphology. In addition there is a rounded area seen immediately posterior to the SMV on series 40, image 2 measuring 13 mm. This appears to follow the density of vasculature on the 2 phases obtained. This actually was present on the study of 2011 and may be a small venous varix or other vascular lesion. Reproductive: Enlarged prostate with heterogeneous enhancement posteriorly on the left. Please correlate with patient's PSA. Other: Scattered free air identified. Etiology of which is uncertain. Trace free fluid in the right pericolic gutter. Musculoskeletal: Scattered degenerative changes of the spine and pelvis. Transitional lumbosacral segment. Curvature of the spine. Critical Value/emergent results were called by telephone at the time of interpretation on 03/18/2023 at 3:25 pm to provider [facility] , who verbally acknowledged these results. IMPRESSION: Free intra-abdominal air. Bowel perforation until proven otherwise. The exact etiology of the free air can not be determined on this examination. There is a mass lesion identified along the ascending colon with focal caliber change of the bowel worrisome for potential neoplasm. There also some abnormal nodes in the right hemi abdominal mesentery and possible liver metastasis. Further workup when appropriate. This could be the source of the free air but indeterminate. There is some fluid adjacent to the ascending colon in the pericolic gutter. Bilateral renal atrophy. Punctate nonobstructing left-sided renal stone. Left-sided bladder stone. Enlarged prostate with heterogeneous enhancement. Please correlate with patient's PSA. Fusiform 4.2 cm infrarenal abdominal aortic aneurysm with some enhancement along the mural plaque posteriorly. Recommend follow-up every 12 months and vascular consultation. This recommendation follows ACR consensus guidelines: White Paper of the ACR Incidental Findings Committee II on Vascular Findings. J Am Coll Radiol 2013;  10:789-794. Electronically Signed   By: Karen Kays M.D.   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  Lesion in liver does not appear to be metastatic.  Patient does not require PET scan at this time.  Postoperative CEA was within normal limits.  Given patient's stage of disease, adjuvant treatment with FOLFOX  x12 for 6 months is recommended, but patient has declined any chemotherapy given his advanced age.  No intervention is needed at this time.  Patient will return to clinic in 3 weeks for further evaluation.  Plan to repeat imaging in 3 months for continued monitoring of liver lesion. Weakness and fatigue: Patient reports he is getting discharged from rehab to home this weekend.  Continue physical therapy as ordered. Anemia: Patient's most recent hemoglobin of 10.0, monitor.    I spent a total of 60 minutes reviewing chart data, face-to-face evaluation with the patient, counseling and coordination of care as detailed above.    Patient expressed understanding and was in agreement with this plan. He also understands that He can call clinic at any time with any questions, concerns, or complaints.    Cancer Staging  Adenocarcinoma of colon [facility]) Staging form: Colon and Rectum, AJCC 8th Edition - Clinical: Stage IIIB (cT4a, cN1a, cM0) - Signed by Jeralyn Ruths, MD on 04/14/2023 Total positive nodes: 1   Jeralyn Ruths, MD   04/14/2023 9:12 AM

## 2023-04-14 NOTE — Patient Instructions (Addendum)
Wear your abdominal binder when you are up and around to give you more support.  You may shower, remove all of your dressings first. Wash as usual, rinse well, and pat dry and redress you wounds.  Continue wet to dry dressing on open abdominal wound until it closes.  We will have you follow up here in 1 month.   We will refer you to [facility] to monitor your clot and blood thinner. They will call you to schedule this appointment. You will need to be on the Eliquis for at least 3 months.

## 2023-04-14 NOTE — Progress Notes (Signed)
04/14/2023  HPI: Don Arias is a 89 y.o. male s/p exlap and right colectomy on 03/18/23 for right colon perforation in the setting of obstructing colon mass.  Final pathology showed two cancers, one of which was unknown and not palpable, which ended up being about 5 mm from the distal margin.  He also had 1 of 31 lymph nodes positive.  He had a prolonged hospital stay for post-op ileus requiring TPN and also a RUE DVT from PICC line.  He is currently on Eliquis.  He was discharged to SNF on 04/04/23.  He saw Dr. Orlie Dakin yesterday with Oncology and overall has decided not to pursue chemotherapy at this point.  He will have follow up imaging in 3 months to check on the liver lesion noted on his CT scans and MRI.  Patient reports that finally his bowel movements are better and had a more normal BM yesterday and this morning.  He is getting discharged from SNF this weekend.  Reports some soreness at the midline still, but has been improving.  He had superficial dehiscence of a portion of his wound and is getting dressing changes.  He does feel more pulling when ambulating due to the weight of his abdominal wall.  Vital signs: BP 113/78   Pulse 86   Temp 98 F (36.7 C)   Ht 5\' 10"  (1.778 m)   Wt 180 lb (81.6 kg)   SpO2 97%   BMI 25.83 kg/m    Physical Exam: Constitutional:  No acute distress Abdomen:  soft, non-distended, appropriately sore to palpation.  Midline incision is healing well, with one small area of superficial dehiscence measuring 3 cm length just superior to the umbilicus.  No evidence of infection.  Wet to dry dressing applied.  Abdominal binder applied.  Assessment/Plan: This is a 89 y.o. male s/p exlap and right colectomy   --Patient is recovering well from his surgery so far.  He does report feeling weak and still a bit shortwinded with activity.  Discussed with him that this will continue to improve as his strength and endurance recover.   --Instructed the patient and  his daughter on dressing changes to do for his wound --Diet as tolerated, no restrictions on food or shower. --Discussed wearing the binder with ambulation/activity, and can have it off otherwise.  Activity restrictions reviewed. --Will send referral to Vascular Surgery for follow up of his RUE DVT. --Follow up in a month to reassess his progress.   Howie Ill, MD [facility]

## 2023-04-17 ENCOUNTER — Other Ambulatory Visit: Payer: Self-pay

## 2023-04-17 MED ORDER — APIXABAN 5 MG PO TABS: 5.0000 mg | ORAL_TABLET | Freq: Two times a day (BID) | ORAL | 2 refills | Status: DC

## 2023-04-18 ENCOUNTER — Other Ambulatory Visit: Payer: Self-pay

## 2023-04-18 MED ORDER — APIXABAN 5 MG PO TABS: 5.0000 mg | ORAL_TABLET | Freq: Two times a day (BID) | ORAL | 2 refills | Status: DC

## 2023-04-19 ENCOUNTER — Encounter: Payer: Medicare Other | Admitting: Surgery

## 2023-05-02 ENCOUNTER — Telehealth: Payer: Self-pay | Admitting: Surgery

## 2023-05-02 ENCOUNTER — Other Ambulatory Visit: Payer: Self-pay

## 2023-05-02 MED ORDER — TAMSULOSIN HCL 0.4 MG PO CAPS: 0.4000 mg | ORAL_CAPSULE | Freq: Every day | ORAL | 0 refills | Status: AC

## 2023-05-02 MED ORDER — TAMSULOSIN HCL 0.4 MG PO CAPS: 0.4000 mg | ORAL_CAPSULE | Freq: Every day | ORAL | 0 refills | Status: DC

## 2023-05-02 NOTE — Telephone Encounter (Signed)
Prescription refilled and patient notified

## 2023-05-02 NOTE — Addendum Note (Signed)
Addended by: Sinda Du on: 05/02/2023 02:47 PM   Modules accepted: Orders

## 2023-05-02 NOTE — Telephone Encounter (Signed)
Patient was placed on Flomax after his surgery with Dr. Aleen Campi. Patient only has one pill left, he runs out tonight.  He doesn't see his primary care doctor until 05/12/23 and needing a refill to hold him until then.  Patient now uses [facility] [address]  in [place], please call when ready. Thank you.

## 2023-05-04 ENCOUNTER — Inpatient Hospital Stay: Payer: Medicare Other | Attending: Oncology | Admitting: Oncology

## 2023-05-04 ENCOUNTER — Encounter: Payer: Self-pay | Admitting: Oncology

## 2023-05-04 VITALS — BP 110/67 | HR 92 | Temp 97.7°F | Resp 16 | Ht 70.0 in | Wt 179.0 lb

## 2023-05-04 DIAGNOSIS — C189 Malignant neoplasm of colon, unspecified: Secondary | ICD-10-CM

## 2023-05-04 DIAGNOSIS — C182 Malignant neoplasm of ascending colon: Secondary | ICD-10-CM | POA: Insufficient documentation

## 2023-05-04 NOTE — Progress Notes (Signed)
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  INTERVAL HISTORY: Patient returns to clinic today for further evaluation.  He is nearly fully recovered from his surgery and back at home out of rehab.  He currently feels well and is asymptomatic.  He has no neurologic complaints.  He denies any recent fevers.  He has a good appetite and is gaining weight.  He has no chest pain, shortness of breath, cough, or hemoptysis.  He denies any nausea, vomiting, constipation, or diarrhea.  He has no abdominal pain.  He denies any melena or hematochezia.  He has no urinary complaints.  Patient offers no specific complaints today.  REVIEW OF SYSTEMS:   Review of Systems  Constitutional: Negative.  Negative for fever, malaise/fatigue and weight loss.  Respiratory: Negative.  Negative for cough, hemoptysis and shortness of breath.   Cardiovascular: Negative.  Negative for chest pain and leg swelling.  Gastrointestinal: Negative.  Negative for abdominal pain, blood in stool, constipation, diarrhea, melena, nausea and vomiting.  Genitourinary: Negative.  Negative for dysuria.  Musculoskeletal: Negative.  Negative for back pain.  Skin: Negative.  Negative for rash.  Neurological: Negative.  Negative for dizziness, focal weakness, weakness and headaches.  Psychiatric/Behavioral: Negative.  The patient is not nervous/anxious.     As per HPI. Otherwise, a complete review of systems is negative.  PAST MEDICAL HISTORY: Past Medical History:  Diagnosis Date   Cancer (HCC)    skin   Hypertension     PAST SURGICAL HISTORY: Past Surgical History:   Procedure Laterality Date   CATARACT EXTRACTION     LAPAROTOMY N/A 03/18/2023   Procedure: EXPLORATORY LAPAROTOMY  POSS. RIGHT COLECTOMY;  Surgeon: Henrene Dodge, MD;  Location: [facility] ORS;  Service: General;  Laterality: N/A;   PROSTATE SURGERY     RECONSTRUCTION OF EYELID     SKIN CANCER EXCISION      FAMILY HISTORY: Family History  Problem Relation Age of Onset   Cancer Mother        Lung    ADVANCED DIRECTIVES (Y/N):  N  HEALTH MAINTENANCE: Social History   Tobacco Use   Smoking status: Former    Types: Cigarettes    Passive exposure: Past   Smokeless tobacco: Former  Building services engineer status: Never Used  Substance Use Topics   Alcohol use: Not Currently   Drug use: Never     Colonoscopy:  PAP:  Bone density:  Lipid panel:  Allergies  Allergen Reactions   Nitroglycerin Other (See Comments)    Cardiac Arrest   Gabapentin     Other Reaction(s): Coordination problem   Hydralazine Hcl     Other Reaction(s): Unknown   Lisinopril Cough    Other reaction(s): Cough   Pregabalin     Other Reaction(s): Dizziness  Other reaction(s): Dizziness   Tramadol     Other Reaction(s): Other (See Comments), Unknown   Valsartan     Other Reaction(s): Cough  Other reaction(s): Cough   Verapamil     Other Reaction(s): Dizziness  Other reaction(s): Dizziness   Guaifenesin Rash   Sulfa Antibiotics Rash    Current Outpatient Medications  Medication Sig Dispense Refill  Amino Acids-Protein Hydrolys (PRO-STAT RENAL CARE PO) Take 8.6 tablets by mouth 2 (two) times daily.     apixaban (ELIQUIS) 5 MG TABS tablet Take 1 tablet (5 mg total) by mouth 2 (two) times daily. 60 tablet 2   ascorbic acid (VITAMIN C) 500 MG tablet Take 500 mg by mouth 2 (two) times daily.     magnesium hydroxide (MILK OF MAGNESIA) 400 MG/5ML suspension Take 30 mLs by mouth daily.     ondansetron (ZOFRAN-ODT) 4 MG disintegrating tablet Take 1 tablet (4 mg total) by mouth every 6 (six) hours as  needed for nausea or vomiting. 20 tablet 0   oxyCODONE (OXY IR/ROXICODONE) 5 MG immediate release tablet Take 1 tablet (5 mg total) by mouth every 6 (six) hours as needed for severe pain or breakthrough pain. 20 tablet 0   polyethylene glycol (MIRALAX / GLYCOLAX) 17 g packet Take 17 g by mouth daily.     senna-docusate (SENOKOT-S) 8.6-50 MG tablet Take 1 tablet by mouth 2 (two) times daily.     tamsulosin (FLOMAX) 0.4 MG CAPS capsule Take 1 capsule (0.4 mg total) by mouth daily. 30 capsule 0   zinc gluconate 50 MG tablet Take 50 mg by mouth daily.     No current facility-administered medications for this visit.    OBJECTIVE: Vitals:   05/04/23 1032  BP: 110/67  Pulse: 92  Resp: 16  Temp: 97.7 F (36.5 C)  SpO2: 99%     Body mass index is 25.68 kg/m.    ECOG FS:0 - Asymptomatic  General: Well-developed, well-nourished, no acute distress. Eyes: Pink conjunctiva, anicteric sclera. HEENT: Normocephalic, moist mucous membranes. Lungs: No audible wheezing or coughing. Heart: Regular rate and rhythm. Abdomen: Soft, nontender, no obvious distention. Musculoskeletal: No edema, cyanosis, or clubbing. Neuro: Alert, answering all questions appropriately. Cranial nerves grossly intact. Skin: No rashes or petechiae noted. Psych: Normal affect.   LAB RESULTS:  Lab Results  Component Value Date   NA 137 04/03/2023   K 3.8 04/03/2023   CL 106 04/03/2023   CO2 24 04/03/2023   GLUCOSE 119 (H) 04/03/2023   BUN 19 04/03/2023   CREATININE 0.91 04/03/2023   CALCIUM 8.6 (L) 04/03/2023   PROT 4.8 (L) 04/03/2023   ALBUMIN 2.0 (L) 04/03/2023   AST 21 04/03/2023   ALT 19 04/03/2023   ALKPHOS 95 04/03/2023   BILITOT 0.6 04/03/2023   GFRNONAA >60 04/03/2023   GFRAA >60 07/17/2013    Lab Results  Component Value Date   WBC 8.5 04/03/2023   NEUTROABS 6.0 07/17/2013   HGB 10.0 (L) 04/03/2023   HCT 30.6 (L) 04/03/2023   MCV 90.0 04/03/2023   PLT 248 04/03/2023     STUDIES: No  results found.  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  Lesion in liver does not appear to be metastatic.  Patient does not require PET scan at this time.  Postoperative CEA was within normal limits.  Given patient's stage of disease, adjuvant treatment with FOLFOX x12 for 6 months is recommended, but patient has declined any chemotherapy given his advanced age.  No intervention is needed at this time.  Return to clinic in 3 months with repeat imaging, laboratory work, and further evaluation.   Weakness and fatigue: Resolved. Anemia: Patient's most recent hemoglobin of 10.0, monitor.    I spent a total of 20  minutes reviewing chart data, face-to-face evaluation with the patient, counseling and coordination of care as detailed above.     Patient expressed understanding and was in agreement with this plan. He also understands that He can call clinic at any time with any questions, concerns, or complaints.    Cancer Staging  Adenocarcinoma of colon [facility]) Staging form: Colon and Rectum, AJCC 8th Edition - Clinical: Stage IIIB (cT4a, cN1a, cM0) - Signed by Jeralyn Ruths, MD on 04/14/2023 Total positive nodes: 1   Jeralyn Ruths, MD   05/04/2023 12:06 PM

## 2023-05-05 ENCOUNTER — Telehealth: Payer: Self-pay | Admitting: Surgery

## 2023-05-05 NOTE — Telephone Encounter (Signed)
Incoming call from [place] with [facility] [phone]). Wanted to let Dr. Aleen Campi know that patient has been non-compliant with therapy this week.  Just refusing to do.  Will try with patient again next week to see if he can get some cooperation out of him.

## 2023-05-11 ENCOUNTER — Encounter (INDEPENDENT_AMBULATORY_CARE_PROVIDER_SITE_OTHER): Payer: Medicare Other | Admitting: Vascular Surgery

## 2023-05-22 ENCOUNTER — Encounter: Payer: Self-pay | Admitting: Surgery

## 2023-05-22 ENCOUNTER — Ambulatory Visit (INDEPENDENT_AMBULATORY_CARE_PROVIDER_SITE_OTHER): Payer: Medicare Other | Admitting: Surgery

## 2023-05-22 VITALS — BP 118/77 | HR 97 | Temp 97.7°F | Ht 70.0 in | Wt 179.0 lb

## 2023-05-22 DIAGNOSIS — Z08 Encounter for follow-up examination after completed treatment for malignant neoplasm: Secondary | ICD-10-CM

## 2023-05-22 DIAGNOSIS — Z86718 Personal history of other venous thrombosis and embolism: Secondary | ICD-10-CM

## 2023-05-22 DIAGNOSIS — Z09 Encounter for follow-up examination after completed treatment for conditions other than malignant neoplasm: Secondary | ICD-10-CM

## 2023-05-22 DIAGNOSIS — C182 Malignant neoplasm of ascending colon: Secondary | ICD-10-CM

## 2023-05-22 NOTE — Progress Notes (Signed)
05/22/2023  HPI: Don Arias is a 89 y.o. male s/p exlap and right colectomy on 03/18/23 for right colon perforation in the setting of obstructing colon mass.  Final pathology showed two cancers, one of which was unknown and not palpable, which ended up being about 5 mm from the distal margin.  He also had 1 of 31 lymph nodes positive.  He had a prolonged hospital stay for post-op ileus requiring TPN and also a RUE DVT from PICC line.  He is currently on Eliquis.  He was discharged to SNF on 04/04/23.  Patient today reports he's doing well.  Denies any abdominal pain, nausea, or vomiting.  He's eating well and having regular bowel movements, without any constipation issues like before.  Denies any blood in the stool.  Vital signs: BP 118/77 (BP Location: Left Arm, Patient Position: Sitting, Cuff Size: Large)   Pulse 97   Temp 97.7 F (36.5 C) (Oral)   Ht 5\' 10"  (1.778 m)   Wt 179 lb (81.2 kg)   SpO2 100%   BMI 25.68 kg/m    Physical Exam: Constitutional:  No acute distress Abdomen:  soft, non-distended, non-tender to palpation.  Incision is fully healed, without any evidence of hernia at this point.   Assessment/Plan: This is a 89 y.o. male s/p exlap and right colectomy   --Patient has continued to do well after his surgery.  Denies any abdominal complaints. --Continue current diet and ambulation/therapy --Follow up in 5 months.   Howie Ill, MD [facility]

## 2023-05-22 NOTE — Patient Instructions (Signed)
Minimally Invasive Partial Colectomy, Adult, Care After The following information offers guidance on how to care for yourself after your procedure. Your health care provider may also give you more specific instructions. If you have problems or questions, contact your health care provider. What can I expect after the surgery? After the procedure, it is common to have: Pain, bruising, and swelling. Bloating. Weakness and tiredness (fatigue). Changes to your bowel movements, especially having bowel movements more often. Follow these instructions at home: Medicines Take over-the-counter and prescription medicines only as told by your health care provider. If you were prescribed an antibiotic medicine, take it as told by your health care provider. Do not stop using the antibiotic even if you start to feel better. Ask your health care provider if the medicine prescribed to you: Requires you to avoid driving or using machinery. Can cause constipation. You may need to take these actions to prevent or treat constipation: Drink enough fluids to keep your urine pale yellow. Take over-the-counter or prescription medicines. Limit foods that are high in fat and processed sugars, such as fried or sweet foods. Eating and drinking Follow instructions from your health care provider about what you may eat and drink. Do not drink alcohol if your health care provider tells you not to drink. Eat a low-fiber diet for the first 4 weeks after surgery or as told by your health care provider.. Most people on a low-fiber eating plan should eat less than 10 grams (g) of fiber a day. Follow recommendations from your health care provider or dietitian about how much fiber you should have each day. Always check food labels to know the fiber content of packaged foods. In general, a low-fiber food will have fewer than 2 g of fiber per serving. In general, try to avoid whole grains, raw fruits and vegetables, dried fruit, tough  cuts of meat, nuts, and seeds. Incision care  Follow instructions from your health care provider about how to take care of your incisions. Make sure you: Wash your hands with soap and water for at least 20 seconds before and after you change your bandage (dressing). If soap and water are not available, use hand sanitizer. Change your dressing as told by your health care provider. Leave stitches (sutures), skin glue, or adhesive strips in place. These skin closures may need to stay in place for 2 weeks or longer. If adhesive strip edges start to loosen and curl up, you may trim the loose edges. Do not remove adhesive strips completely unless your health care provider tells you to do that. Check your incision area every day for signs of infection. Check for: More redness, swelling, or pain. Fluid or blood. Warmth. Pus or a bad smell. Activity Rest as told by your health care provider. Avoid sitting for a long time without moving. Get up to take short walks every 1-2 hours. This is important to improve blood flow and breathing. Ask for help if you feel weak or unsteady. You may have to avoid lifting. Ask your health care provider how much you can safely lift. Return to your normal activities as told by your health care provider. Ask your health care provider what activities are safe for you. General instructions Do not use any products that contain nicotine or tobacco. These products include cigarettes, chewing tobacco, and vaping devices, such as e-cigarettes. If you need help quitting, ask your health care provider. Do not take baths, swim, or use a hot tub until your health care provider  approves. Ask your health care provider if you may take showers. You may only be allowed to take sponge baths. Wear compression stockings as told by your health care provider. These stockings help to prevent blood clots and reduce swelling in your legs. Keep all follow-up visits. This is important to monitor  healing and check for any complications. Contact a health care provider if: Medicine is not controlling your pain. You have chills or fever. You have any signs of infection in your incision areas. You have a persistent cough. You have nausea or vomiting. You develop a rash. You have not had a bowel movement in 3 days. Get help right away if: You have severe pain. Your incisions break open after sutures or staples have been removed. You are bleeding from your rectum or have blood in your stool. You have a warm, tender swelling in your leg. You have chest pain or trouble breathing. You have increased swelling in the abdomen. You feel light-headed or you faint. These symptoms may be an emergency. Get help right away. Call 911. Do not wait to see if the symptoms will go away. Do not drive yourself to the hospital. Summary After surgery, it is common to have some pain, bruising, swelling, bloating, tiredness, weakness, or changes to your bowel movements. Follow instructions from your health care provider about what to eat and drink. Return to your normal activities as told by your health care provider. Check your incision area every day for signs of infection. Get help right away if you have chest pain or trouble breathing. This information is not intended to replace advice given to you by your health care provider. Make sure you discuss any questions you have with your health care provider. Document Revised: 11/24/2021 Document Reviewed: 11/24/2021 Elsevier Patient Education  2024 [employer].

## 2023-05-30 ENCOUNTER — Other Ambulatory Visit: Payer: Self-pay | Admitting: Surgery

## 2023-08-03 ENCOUNTER — Ambulatory Visit
Admission: RE | Admit: 2023-08-03 | Discharge: 2023-08-03 | Disposition: A | Discharge status: Home / Self Care | Payer: Medicare Other | Source: Ambulatory Visit | Attending: Oncology | Admitting: Oncology

## 2023-08-03 ENCOUNTER — Inpatient Hospital Stay: Payer: Medicare Other | Attending: Oncology

## 2023-08-03 DIAGNOSIS — K769 Liver disease, unspecified: Secondary | ICD-10-CM | POA: Insufficient documentation

## 2023-08-03 DIAGNOSIS — Z85038 Personal history of other malignant neoplasm of large intestine: Secondary | ICD-10-CM | POA: Diagnosis present

## 2023-08-03 DIAGNOSIS — D649 Anemia, unspecified: Secondary | ICD-10-CM | POA: Diagnosis not present

## 2023-08-03 DIAGNOSIS — C189 Malignant neoplasm of colon, unspecified: Secondary | ICD-10-CM

## 2023-08-03 LAB — CBC WITH DIFFERENTIAL/PLATELET
Abs Immature Granulocytes: 0.03 10*3/uL (ref 0.00–0.07)
Basophils Absolute: 0 10*3/uL (ref 0.0–0.1)
Basophils Relative: 0 %
Eosinophils Absolute: 0.1 10*3/uL (ref 0.0–0.5)
Eosinophils Relative: 1 %
HCT: 37.3 % — ABNORMAL LOW (ref 39.0–52.0)
Hemoglobin: 11.6 g/dL — ABNORMAL LOW (ref 13.0–17.0)
Immature Granulocytes: 1 %
Lymphocytes Relative: 18 %
Lymphs Abs: 1.2 10*3/uL (ref 0.7–4.0)
MCH: 24.6 pg — ABNORMAL LOW (ref 26.0–34.0)
MCHC: 31.1 g/dL (ref 30.0–36.0)
MCV: 79.2 fL — ABNORMAL LOW (ref 80.0–100.0)
Monocytes Absolute: 0.4 10*3/uL (ref 0.1–1.0)
Monocytes Relative: 6 %
Neutro Abs: 4.9 10*3/uL (ref 1.7–7.7)
Neutrophils Relative %: 74 %
Platelets: 169 10*3/uL (ref 150–400)
RBC: 4.71 MIL/uL (ref 4.22–5.81)
RDW: 18 % — ABNORMAL HIGH (ref 11.5–15.5)
WBC: 6.5 10*3/uL (ref 4.0–10.5)
nRBC: 0 % (ref 0.0–0.2)

## 2023-08-03 LAB — CMP (CANCER CENTER ONLY)
ALT: 20 U/L (ref 0–44)
AST: 20 U/L (ref 15–41)
Albumin: 3.6 g/dL (ref 3.5–5.0)
Alkaline Phosphatase: 62 U/L (ref 38–126)
Anion gap: 8 (ref 5–15)
BUN: 26 mg/dL — ABNORMAL HIGH (ref 8–23)
CO2: 25 mmol/L (ref 22–32)
Calcium: 8.9 mg/dL (ref 8.9–10.3)
Chloride: 110 mmol/L (ref 98–111)
Creatinine: 1.02 mg/dL (ref 0.61–1.24)
GFR, Estimated: >60 mL/min (ref >60)
Glucose, Bld: 113 mg/dL — ABNORMAL HIGH (ref 70–99)
Potassium: 3.8 mmol/L (ref 3.5–5.1)
Sodium: 143 mmol/L (ref 135–145)
Total Bilirubin: 0.6 mg/dL (ref <1.2)
Total Protein: 6.1 g/dL — ABNORMAL LOW (ref 6.5–8.1)

## 2023-08-03 MED ORDER — IOHEXOL 300 MG/ML  SOLN
100.0000 mL | Freq: Once | INTRAMUSCULAR | Status: AC | PRN
  Administered 2023-08-03: 100 mL via INTRAVENOUS

## 2023-08-05 LAB — CEA: CEA: 0.9 ng/mL (ref 0.0–4.7)

## 2023-08-10 ENCOUNTER — Other Ambulatory Visit: Payer: Self-pay

## 2023-08-10 ENCOUNTER — Inpatient Hospital Stay: Payer: Medicare Other | Admitting: Oncology

## 2023-08-10 ENCOUNTER — Encounter: Payer: Self-pay | Admitting: Oncology

## 2023-08-10 VITALS — BP 156/83 | HR 73 | Temp 97.8°F | Resp 16 | Ht 70.0 in | Wt 188.0 lb

## 2023-08-10 DIAGNOSIS — C189 Malignant neoplasm of colon, unspecified: Secondary | ICD-10-CM | POA: Diagnosis not present

## 2023-08-10 DIAGNOSIS — Z85038 Personal history of other malignant neoplasm of large intestine: Secondary | ICD-10-CM | POA: Diagnosis not present

## 2023-08-10 NOTE — Progress Notes (Signed)
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  INTERVAL HISTORY: Patient returns to clinic today for repeat laboratory work, further evaluation, and discussion of his imaging results.  He currently feels well and is asymptomatic.  He does not complain of any weakness or fatigue and feels back to his baseline. He has no neurologic complaints.  He denies any recent fevers.  He has a good appetite and continues to gain weight.  He has no chest pain, shortness of breath, cough, or hemoptysis.  He denies any nausea, vomiting, constipation, or diarrhea.  He has no abdominal pain.  He denies any melena or hematochezia.  He has no urinary complaints.  Patient offers no specific complaints today.  REVIEW OF SYSTEMS:   Review of Systems  Constitutional: Negative.  Negative for fever, malaise/fatigue and weight loss.  Respiratory: Negative.  Negative for cough, hemoptysis and shortness of breath.   Cardiovascular: Negative.  Negative for chest pain and leg swelling.  Gastrointestinal: Negative.  Negative for abdominal pain, blood in stool, constipation, diarrhea, melena, nausea and vomiting.  Genitourinary: Negative.  Negative for dysuria.  Musculoskeletal: Negative.  Negative for back pain.  Skin: Negative.  Negative for rash.  Neurological: Negative.  Negative for dizziness, focal weakness, weakness and headaches.  Psychiatric/Behavioral: Negative.  The patient is not nervous/anxious.     As per HPI. Otherwise, a complete review of systems is negative.  PAST MEDICAL HISTORY: Past Medical History:  Diagnosis Date   Cancer (HCC)    skin    Hypertension     PAST SURGICAL HISTORY: Past Surgical History:  Procedure Laterality Date   CATARACT EXTRACTION     LAPAROTOMY N/A 03/18/2023   Procedure: EXPLORATORY LAPAROTOMY  POSS. RIGHT COLECTOMY;  Surgeon: Henrene Dodge, MD;  Location: [facility] ORS;  Service: General;  Laterality: N/A;   PROSTATE SURGERY     RECONSTRUCTION OF EYELID     SKIN CANCER EXCISION      FAMILY HISTORY: Family History  Problem Relation Age of Onset   Cancer Mother        Lung    ADVANCED DIRECTIVES (Y/N):  N  HEALTH MAINTENANCE: Social History   Tobacco Use   Smoking status: Former    Types: Cigarettes    Passive exposure: Past   Smokeless tobacco: Former  Building services engineer status: Never Used  Substance Use Topics   Alcohol use: Not Currently   Drug use: Never     Colonoscopy:  PAP:  Bone density:  Lipid panel:  Allergies  Allergen Reactions   Nitroglycerin Other (See Comments)    Cardiac Arrest   Gabapentin     Other Reaction(s): Coordination problem   Hydralazine Hcl     Other Reaction(s): Unknown   Lisinopril Cough    Other reaction(s): Cough   Pregabalin     Other Reaction(s): Dizziness  Other reaction(s): Dizziness   Tramadol     Other Reaction(s): Other (See Comments), Unknown   Valsartan     Other Reaction(s): Cough  Other reaction(s): Cough   Verapamil     Other Reaction(s): Dizziness  Other reaction(s): Dizziness   Guaifenesin Rash   Sulfa Antibiotics Rash    Current  Outpatient Medications  Medication Sig Dispense Refill   ascorbic acid (VITAMIN C) 500 MG tablet Take 500 mg by mouth 2 (two) times daily.     cholecalciferol (VITAMIN D3) 25 MCG (1000 UNIT) tablet Take 1,000 Units by mouth daily.     tamsulosin (FLOMAX) 0.4 MG CAPS capsule Take 0.4 mg by mouth daily after supper.     Amino Acids-Protein Hydrolys (PRO-STAT RENAL CARE PO) Take 8.6 tablets by mouth 2 (two) times daily. (Patient not taking: Reported on 08/10/2023)     apixaban (ELIQUIS) 5 MG  TABS tablet Take 1 tablet (5 mg total) by mouth 2 (two) times daily. (Patient not taking: Reported on 08/10/2023) 60 tablet 2   magnesium hydroxide (MILK OF MAGNESIA) 400 MG/5ML suspension Take 30 mLs by mouth daily. (Patient not taking: Reported on 08/10/2023)     ondansetron (ZOFRAN-ODT) 4 MG disintegrating tablet Take 1 tablet (4 mg total) by mouth every 6 (six) hours as needed for nausea or vomiting. (Patient not taking: Reported on 08/10/2023) 20 tablet 0   polyethylene glycol (MIRALAX / GLYCOLAX) 17 g packet Take 17 g by mouth daily. (Patient not taking: Reported on 08/10/2023)     senna-docusate (SENOKOT-S) 8.6-50 MG tablet Take 1 tablet by mouth 2 (two) times daily. (Patient not taking: Reported on 08/10/2023)     zinc gluconate 50 MG tablet Take 50 mg by mouth daily. (Patient not taking: Reported on 08/10/2023)     No current facility-administered medications for this visit.    OBJECTIVE: Vitals:   08/10/23 1052  BP: (!) 156/83  Pulse: 73  Resp: 16  Temp: 97.8 F (36.6 C)  SpO2: 100%     Body mass index is 26.98 kg/m.    ECOG FS:0 - Asymptomatic  General: Well-developed, well-nourished, no acute distress. Eyes: Pink conjunctiva, anicteric sclera. HEENT: Normocephalic, moist mucous membranes. Lungs: No audible wheezing or coughing. Heart: Regular rate and rhythm. Abdomen: Soft, nontender, no obvious distention. Musculoskeletal: No edema, cyanosis, or clubbing. Neuro: Alert, answering all questions appropriately. Cranial nerves grossly intact. Skin: No rashes or petechiae noted. Psych: Normal affect.  LAB RESULTS:  Lab Results  Component Value Date   NA 143 08/03/2023   K 3.8 08/03/2023   CL 110 08/03/2023   CO2 25 08/03/2023   GLUCOSE 113 (H) 08/03/2023   BUN 26 (H) 08/03/2023   CREATININE 1.02 08/03/2023   CALCIUM 8.9 08/03/2023   PROT 6.1 (L) 08/03/2023   ALBUMIN 3.6 08/03/2023   AST 20 08/03/2023   ALT 20 08/03/2023   ALKPHOS 62 08/03/2023   BILITOT 0.6  08/03/2023   GFRNONAA >60 08/03/2023   GFRAA >60 07/17/2013    Lab Results  Component Value Date   WBC 6.5 08/03/2023   NEUTROABS 4.9 08/03/2023   HGB 11.6 (L) 08/03/2023   HCT 37.3 (L) 08/03/2023   MCV 79.2 (L) 08/03/2023   PLT 169 08/03/2023     STUDIES: CT ABDOMEN PELVIS W CONTRAST Result Date: 08/10/2023 CLINICAL DATA:  Colon cancer restaging.  * Tracking Code: BO * EXAM: CT ABDOMEN AND PELVIS WITH CONTRAST TECHNIQUE: Multidetector CT imaging of the abdomen and pelvis was performed using the standard protocol following bolus administration of intravenous contrast. RADIATION DOSE REDUCTION: This exam was performed according to the departmental dose-optimization program which includes automated exposure control, adjustment of the mA and/or kV according to patient size and/or use of iterative reconstruction technique. CONTRAST:  OMNIPAQUE IOHEXOL 300 MG/ML  SOLN COMPARISON:  MRI abdomen 04/03/2023.  Chest and  pelvis CT 03/31/2023 FINDINGS: Lower chest: No acute findings Hepatobiliary: 2.4 x 1.9 cm low-density lesion in the posterior right liver (20/2) is not substantially changed from 2.5 x 1.8 cm when I remeasure it in a similar fashion on the prior CT scan. This was evaluated by MRI on 04/03/2023 and characterize is indeterminate. No new liver abnormality. There is no evidence for gallstones, gallbladder wall thickening, or pericholecystic fluid. No intrahepatic or extrahepatic biliary dilation. Pancreas: No focal mass lesion. No dilatation of the main duct. No intraparenchymal cyst. No peripancreatic edema. Spleen: No splenomegaly. No suspicious focal mass lesion. Adrenals/Urinary Tract: No adrenal nodule or mass. Kidneys unremarkable. No evidence for hydroureter. 12 x 7 mm stone is identified in the bladder. This stone was in the region of the left UVJ on the previous exam. Stomach/Bowel: Tiny hiatal hernia. Stomach is distended with contrast material. Duodenum is normally positioned as  is the ligament of Treitz. No small bowel wall thickening. No small bowel dilatation. Right hemicolectomy. Diverticular changes are noted in the left colon without evidence of diverticulitis. Vascular/Lymphatic: Similar appearance 4.2 x 4.1 cm infrarenal abdominal aortic aneurysm with prominent mural thrombus. Aneurysm has a saccular appearance on coronal and sagittal imaging. There is no gastrohepatic or hepatoduodenal ligament lymphadenopathy. No retroperitoneal or mesenteric lymphadenopathy. No pelvic sidewall lymphadenopathy. Reproductive: Prostate gland is markedly enlarged. Other: No intraperitoneal free fluid. Musculoskeletal: No worrisome lytic or sclerotic osseous abnormality. IMPRESSION: 1. No substantial change in the 2.4 x 1.9 cm low-density lesion in the posterior right liver. This was evaluated by MRI on 04/03/2023 and characterized as indeterminate. 2. No new liver abnormality. No other new or progressive findings to suggest additional sites of metastatic involvement. 3. The 10 mm fluid density lesion in the anterior abdomen identified previously has resolved in the interval. 4. 12 x 7 mm stone in the bladder. This stone was in the region of the left UVJ on the previous exam. No evidence for hydroureter. 5. Similar appearance 4.2 x 4.1 cm infrarenal abdominal aortic aneurysm with prominent mural thrombus. Aneurysm has a saccular appearance on coronal and sagittal imaging. Recommend follow-up CT or MR as appropriate in 12 months and referral to or continued care with vascular specialist. (Ref.: J Vasc Surg. 2018; 67:2-77 and J Am Coll Radiol 2013;10(10):789-794.) 6. Tiny hiatal hernia. 7. Markedly enlarged prostate gland. 8. Left colonic diverticulosis without diverticulitis. Electronically Signed   By: Kennith Center M.D.   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 Given patient's stage of disease, adjuvant treatment with FOLFOX x12 for 6 months is recommended, but patient declined any chemotherapy given his advanced age.  CEA continues to be within normal limits.  Repeat CT scan on August 03, 2023 reviewed independently and report as above with no obvious evidence of recurrent or progressive disease.  Liver lesion is likely benign.  No intervention is needed at this time.  Return to clinic in 6 months with repeat imaging, laboratory work, and further evaluation.  Liver lesion: No substantial change since MRI on April 03, 2023.  Repeat imaging as above.  Anemia: Patient's hemoglobin is improving and is now 11.6.      I spent a total of 20 minutes reviewing chart data, face-to-face evaluation with the patient, counseling and coordination of care as detailed above.   Patient expressed understanding and  was in agreement with this plan. He also understands that He can call clinic at any time with any questions, concerns, or complaints.    Cancer Staging  Adenocarcinoma of colon [facility]) Staging form: Colon and Rectum, AJCC 8th Edition - Clinical: Stage IIIB (cT4a, cN1a, cM0) - Signed by Jeralyn Ruths, MD on 04/14/2023 Total positive nodes: 1   Jeralyn Ruths, MD   08/10/2023 2:00 PM

## 2023-10-23 ENCOUNTER — Ambulatory Visit: Payer: Medicare Other | Admitting: Surgery

## 2023-10-23 ENCOUNTER — Encounter: Payer: Self-pay | Admitting: Surgery

## 2023-10-23 VITALS — BP 192/96 | HR 82 | Temp 97.9°F | Ht 70.0 in | Wt 192.8 lb

## 2023-10-23 DIAGNOSIS — C182 Malignant neoplasm of ascending colon: Secondary | ICD-10-CM

## 2023-10-23 NOTE — Patient Instructions (Signed)
 Exploratory Laparotomy, Adult, Care After After exploratory laparotomy, it's common to have pain, tiredness, bloating, and gas. You may also have no desire to eat food. Follow these instructions at home: Medicines Take over-the-counter and prescription medicines only as told by your health care provider. Take your antibiotics as told by your provider. Do not stop taking your antibiotics even if you start to feel better. Ask your provider if the medicine prescribed to you: Requires you to avoid driving or using machinery. Can cause constipation, or trouble pooping. You may need to take these actions to prevent or treat pooping problems: Drink enough fluid to keep your pee (urine) pale yellow. Take over-the-counter or prescription medicines. Eat foods that are high in fiber, such as beans, whole grains, and fresh fruits and vegetables. Limit foods that are high in fat and processed sugars, such as fried or sweet foods. Incision care  Follow instructions from your provider about how to take care of your incision, or the cut in your belly. Make sure you: Wash your hands with soap and water for at least 20 seconds before and after you change your bandage. If soap and water are not available, use hand sanitizer. Change your bandage as told by your provider. Leave stitches, skin glue, or tape strips in place. These skin closures may need to stay in place for 2 weeks or longer. If tape strip edges start to loosen and curl up, you may trim the loose edges. Do not remove tape strips completely unless your provider tells you to do that. If you were sent home with a drain, follow instructions from your provider about how to care for it. Check the cut in your belly every day for signs of infection. Check for: Redness, swelling, or more pain. Fluid or blood. Warmth. Pus or a bad smell. Activity  Rest as told by your provider. Do not sit for a long time without moving. Get up to take short walks every  1-2 hours. This will improve blood flow and breathing. Ask for help if you feel weak or unsteady. You may have to avoid lifting. Ask your provider how much you can safely lift. Return to your normal activities as told by your provider. Ask your provider what activities are safe for you. General instructions Do not use any products that contain nicotine or tobacco. These products include cigarettes, chewing tobacco, and vaping devices, such as e-cigarettes. These can delay incision healing after surgery. If you need help quitting, ask your provider. Do not take baths, swim, or use a hot tub until your provider approves. Ask your provider if you may take showers. You may only be allowed to take sponge baths. Keep all follow-up visits. Your provider will check for problems and make sure you are healing. Your provider may give you more instructions. Make sure you know what you can and can't do. Contact a health care provider if: You have a fever or chills. Your pain is getting worse or your pain medicine is not helping. You vomit or feel like you may vomit. You have watery poop. You have trouble pooping or have not had a bowel movement for more than 3 days. You have signs of infection around the cut that was made in your belly. Get help right away if: The cut in your belly opens up. You have warmth, tenderness, or swelling in your calf. You have trouble breathing. You have chest pain. These symptoms may be an emergency. Get help right away. Call 911. Do  not wait to see if the symptoms will go away. Do not drive yourself to the hospital. This information is not intended to replace advice given to you by your health care provider. Make sure you discuss any questions you have with your health care provider. Document Revised: 10/26/2022 Document Reviewed: 10/26/2022 Elsevier Patient Education  2024 [employer].

## 2023-10-23 NOTE — Progress Notes (Signed)
 10/23/2023  History of Present Illness: Don Arias is a 89 y.o. male status post exploratory laparotomy with right colectomy on 03/18/2023 for a perforating obstructing right colon mass.  Final pathology showed 2 areas of adenocarcinoma including a distal area that was 5 mm away from our staple line.  1 of 31 lymph nodes was positive.  After discussing with Dr. Orlie Dakin, the patient declined doing chemotherapy.  He had a CT scan of the abdomen and pelvis on 08/03/2023 which did not show any enlarged lymph nodes or any suspicious findings.  His CEA at that time was also low at 0.9.  He reports that he has been doing well and has been gaining more energy and eating well.  He is now back on his lawnmower again like he was before surgery.  He is having normal bowel function and denies any abdominal pain.  Past Medical History: Past Medical History:  Diagnosis Date   Cancer (HCC)    skin   Hypertension      Past Surgical History: Past Surgical History:  Procedure Laterality Date   CATARACT EXTRACTION     LAPAROTOMY N/A 03/18/2023   Procedure: EXPLORATORY LAPAROTOMY  POSS. RIGHT COLECTOMY;  Surgeon: Henrene Dodge, MD;  Location: [facility] ORS;  Service: General;  Laterality: N/A;   PROSTATE SURGERY     RECONSTRUCTION OF EYELID     SKIN CANCER EXCISION      Home Medications: Prior to Admission medications   Medication Sig Start Date End Date Taking? Authorizing Provider  ascorbic acid (VITAMIN C) 500 MG tablet Take 500 mg by mouth 2 (two) times daily.   Yes [provider]  cholecalciferol (VITAMIN D3) 25 MCG (1000 UNIT) tablet Take 1,000 Units by mouth daily.   Yes [provider]  Probiotic Product (PROBIOTIC BLEND PO) Take by mouth.   Yes [provider]    Allergies: Allergies  Allergen Reactions   Nitroglycerin Other (See Comments)    Cardiac Arrest   Gabapentin     Other Reaction(s): Coordination problem   Hydralazine Hcl     Other Reaction(s): Unknown    Lisinopril Cough    Other reaction(s): Cough   Pregabalin     Other Reaction(s): Dizziness  Other reaction(s): Dizziness   Tramadol     Other Reaction(s): Other (See Comments), Unknown   Valsartan     Other Reaction(s): Cough  Other reaction(s): Cough   Verapamil     Other Reaction(s): Dizziness  Other reaction(s): Dizziness   Guaifenesin Rash   Sulfa Antibiotics Rash    Review of Systems: Review of Systems  Constitutional:  Negative for chills and fever.  Respiratory:  Negative for shortness of breath.   Cardiovascular:  Negative for chest pain.  Gastrointestinal:  Negative for abdominal pain, nausea and vomiting.    Physical Exam BP (!) 192/96   Pulse 82   Temp 97.9 F (36.6 C) (Oral)   Ht 5\' 10"  (1.778 m)   Wt 192 lb 12.8 oz (87.5 kg)   SpO2 98%   BMI 27.66 kg/m  CONSTITUTIONAL: No acute distress, well nourished. HEENT:  Normocephalic, atraumatic, extraocular motion intact. RESPIRATORY:  Normal respiratory effort without pathologic use of accessory muscles. CARDIOVASCULAR: Regular rhythm and rate  GI: The abdomen is soft, nondistended, and with mild discomfort to palpation at the middle point of the midline incision.  There is no evidence of any hernia and there may be a component of diastases instead.  No palpable masses.  NEUROLOGIC:  Motor  and sensation is grossly normal.  Cranial nerves are grossly intact. PSYCH:  Alert and oriented to person, place and time. Affect is normal.  Labs/Imaging: CT abdomen/pelvis on 08/03/2023: IMPRESSION: 1. No substantial change in the 2.4 x 1.9 cm low-density lesion in the posterior right liver. This was evaluated by MRI on 04/03/2023 and characterized as indeterminate. 2. No new liver abnormality. No other new or progressive findings to suggest additional sites of metastatic involvement. 3. The 10 mm fluid density lesion in the anterior abdomen identified previously has resolved in the interval. 4. 12 x 7 mm stone in the  bladder. This stone was in the region of the left UVJ on the previous exam. No evidence for hydroureter. 5. Similar appearance 4.2 x 4.1 cm infrarenal abdominal aortic aneurysm with prominent mural thrombus. Aneurysm has a saccular appearance on coronal and sagittal imaging. Recommend follow-up CT or MR as appropriate in 12 months and referral to or continued care with vascular specialist. (Ref.: J Vasc Surg. 2018; 67:2-77 and J Am Coll Radiol 2013;10(10):789-794.) 6. Tiny hiatal hernia. 7. Markedly enlarged prostate gland. 8. Left colonic diverticulosis without diverticulitis.    Assessment and Plan: This is a 89 y.o. male status post exploratory laparotomy and right colectomy for perforating colon cancer.  - Patient is doing well from the abdominal standpoint and denies any abdominal pain, nausea, vomiting.  He is having regular bowel function and feels so much better compared to before surgery.  He is regaining more strength and is now back to doing his usual activities before surgery. - Patient had declined chemotherapy.  Discussed with him also the options for doing a colonoscopy around a year time after his surgery and particularly because of the distal area close to the staple line that was found in final pathology analysis.  After further discussion of the pros and cons of this, he has opted to also declined colonoscopy in the future.  The patient feels that if anything further were to happen he may not want to do any more procedures.  I think there is very reasonable at this point particularly given his age as well even though he is quite functional. - In the meantime, we will continue with serial follow-ups and also serial imaging follow-ups.  He is due for repeat CT scan in June 2025.  He will follow-up with me in 5 months.  I spent 20 minutes dedicated to the care of this patient on the date of this encounter to include pre-visit review of records, face-to-face time with the patient  discussing diagnosis and management, and any post-visit coordination of care.   Howie Ill, MD [facility]

## 2024-01-19 ENCOUNTER — Telehealth: Payer: Self-pay | Admitting: *Deleted

## 2024-01-19 NOTE — Telephone Encounter (Signed)
 [name]-the daughter states that the patient is having lesions that are open on his nose and 2 spots looks like a ulcer she states. 01/29/2024.  Feels that he needs to come over and get seen.  Sees Dr.  Adrian Alba and he follows him about colon cancer.  I spoke with Dr. Adrian Alba and the team and they suggest that she sends him to his primary care doctor.  His primary doctor is the [place] with Dr. Lance Tegen.  Aster does the [place] with the primary care be able to send pictures in about these ulcers and she says yes so I told her that she should do that and that way maybe they could give him something or know what to do without having to go up to the [place]. she says that is a great idea and also on June 9 and still be here so you have another person that you could look at.  She is okay with that plan

## 2024-01-19 NOTE — Telephone Encounter (Deleted)
 [name]-the daughter states that the patient is having lesions that are open on his nose and 2 spots looks like a ulcer she states. 01/29/2024.  Feels that he needs to come over and get seen.  Sees Dr. And again for colon cancer.

## 2024-01-26 ENCOUNTER — Other Ambulatory Visit: Payer: Self-pay | Admitting: *Deleted

## 2024-01-26 DIAGNOSIS — C189 Malignant neoplasm of colon, unspecified: Secondary | ICD-10-CM

## 2024-01-29 ENCOUNTER — Inpatient Hospital Stay: Payer: Medicare Other | Attending: Oncology

## 2024-01-29 ENCOUNTER — Ambulatory Visit
Admission: RE | Admit: 2024-01-29 | Discharge: 2024-01-29 | Disposition: A | Discharge status: Home / Self Care | Payer: Medicare Other | Source: Ambulatory Visit | Attending: Oncology | Admitting: Oncology

## 2024-01-29 DIAGNOSIS — K769 Liver disease, unspecified: Secondary | ICD-10-CM | POA: Insufficient documentation

## 2024-01-29 DIAGNOSIS — Z85038 Personal history of other malignant neoplasm of large intestine: Secondary | ICD-10-CM | POA: Insufficient documentation

## 2024-01-29 DIAGNOSIS — C189 Malignant neoplasm of colon, unspecified: Secondary | ICD-10-CM | POA: Diagnosis not present

## 2024-01-29 DIAGNOSIS — Z87891 Personal history of nicotine dependence: Secondary | ICD-10-CM | POA: Diagnosis not present

## 2024-01-29 LAB — CBC WITH DIFFERENTIAL/PLATELET
Abs Immature Granulocytes: 0.04 10*3/uL (ref 0.00–0.07)
Basophils Absolute: 0 10*3/uL (ref 0.0–0.1)
Basophils Relative: 0 %
Eosinophils Absolute: 0.1 10*3/uL (ref 0.0–0.5)
Eosinophils Relative: 2 %
HCT: 44.7 % (ref 39.0–52.0)
Hemoglobin: 14.4 g/dL (ref 13.0–17.0)
Immature Granulocytes: 1 %
Lymphocytes Relative: 25 %
Lymphs Abs: 1.8 10*3/uL (ref 0.7–4.0)
MCH: 30.1 pg (ref 26.0–34.0)
MCHC: 32.2 g/dL (ref 30.0–36.0)
MCV: 93.3 fL (ref 80.0–100.0)
Monocytes Absolute: 0.5 10*3/uL (ref 0.1–1.0)
Monocytes Relative: 6 %
Neutro Abs: 5 10*3/uL (ref 1.7–7.7)
Neutrophils Relative %: 66 %
Platelets: 162 10*3/uL (ref 150–400)
RBC: 4.79 MIL/uL (ref 4.22–5.81)
RDW: 15.6 % — ABNORMAL HIGH (ref 11.5–15.5)
WBC: 7.5 10*3/uL (ref 4.0–10.5)
nRBC: 0 % (ref 0.0–0.2)

## 2024-01-29 LAB — CMP (CANCER CENTER ONLY)
ALT: 24 U/L (ref 0–44)
AST: 23 U/L (ref 15–41)
Albumin: 3.6 g/dL (ref 3.5–5.0)
Alkaline Phosphatase: 60 U/L (ref 38–126)
Anion gap: 7 (ref 5–15)
BUN: 28 mg/dL — ABNORMAL HIGH (ref 8–23)
CO2: 26 mmol/L (ref 22–32)
Calcium: 9.2 mg/dL (ref 8.9–10.3)
Chloride: 108 mmol/L (ref 98–111)
Creatinine: 1 mg/dL (ref 0.61–1.24)
GFR, Estimated: >60 mL/min (ref >60)
Glucose, Bld: 111 mg/dL — ABNORMAL HIGH (ref 70–99)
Potassium: 3.9 mmol/L (ref 3.5–5.1)
Sodium: 141 mmol/L (ref 135–145)
Total Bilirubin: 0.9 mg/dL (ref 0.0–1.2)
Total Protein: 6.4 g/dL — ABNORMAL LOW (ref 6.5–8.1)

## 2024-01-29 MED ORDER — IOHEXOL 300 MG/ML  SOLN
100.0000 mL | Freq: Once | INTRAMUSCULAR | Status: AC | PRN
  Administered 2024-01-29: 100 mL via INTRAVENOUS

## 2024-01-30 LAB — CEA: CEA: 1.5 ng/mL (ref 0.0–4.7)

## 2024-02-05 ENCOUNTER — Encounter: Payer: Self-pay | Admitting: Oncology

## 2024-02-05 ENCOUNTER — Inpatient Hospital Stay: Payer: Medicare Other | Admitting: Oncology

## 2024-02-05 VITALS — BP 135/86 | HR 72 | Temp 97.7°F | Resp 16 | Wt 198.0 lb

## 2024-02-05 DIAGNOSIS — C189 Malignant neoplasm of colon, unspecified: Secondary | ICD-10-CM | POA: Diagnosis not present

## 2024-02-05 DIAGNOSIS — Z85038 Personal history of other malignant neoplasm of large intestine: Secondary | ICD-10-CM | POA: Diagnosis not present

## 2024-02-05 NOTE — Progress Notes (Signed)
 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  INTERVAL HISTORY: Patient returns to clinic today for routine 88-month evaluation and discussion of his imaging results.  He continues to feel well and remains asymptomatic.  He has no neurologic complaints.  He denies any recent fevers.  He has a good appetite and denies weight loss.  He has no chest pain, shortness of breath, cough, or hemoptysis.  He denies any nausea, vomiting, constipation, or diarrhea.  He has no abdominal pain.  He denies any melena or hematochezia.  He has no urinary complaints.  Patient offers no specific complaints today.  REVIEW OF SYSTEMS:   Review of Systems  Constitutional: Negative.  Negative for fever, malaise/fatigue and weight loss.  Respiratory: Negative.  Negative for cough, hemoptysis and shortness of breath.   Cardiovascular: Negative.  Negative for chest pain and leg swelling.  Gastrointestinal: Negative.  Negative for abdominal pain, blood in stool, constipation, diarrhea, melena, nausea and vomiting.  Genitourinary: Negative.  Negative for dysuria.  Musculoskeletal: Negative.  Negative for back pain.  Skin: Negative.  Negative for rash.  Neurological: Negative.  Negative for dizziness, focal weakness, weakness and headaches.  Psychiatric/Behavioral: Negative.  The patient is not nervous/anxious.     As per HPI. Otherwise, a complete review of systems is negative.  PAST MEDICAL HISTORY: Past Medical History:  Diagnosis Date   Cancer (HCC)    skin   Hypertension     PAST SURGICAL HISTORY: Past Surgical History:  Procedure Laterality Date    CATARACT EXTRACTION     LAPAROTOMY N/A 03/18/2023   Procedure: EXPLORATORY LAPAROTOMY  POSS. RIGHT COLECTOMY;  Surgeon: Emmalene Hare, MD;  Location: [facility] ORS;  Service: General;  Laterality: N/A;   PROSTATE SURGERY     RECONSTRUCTION OF EYELID     SKIN CANCER EXCISION      FAMILY HISTORY: Family History  Problem Relation Age of Onset   Cancer Mother        Lung    ADVANCED DIRECTIVES (Y/N):  N  HEALTH MAINTENANCE: Social History   Tobacco Use   Smoking status: Former    Types: Cigarettes    Passive exposure: Past   Smokeless tobacco: Former  Building services engineer status: Never Used  Substance Use Topics   Alcohol use: Not Currently   Drug use: Never     Colonoscopy:  PAP:  Bone density:  Lipid panel:  Allergies  Allergen Reactions   Nitroglycerin Other (See Comments)    Cardiac Arrest   Gabapentin     Other Reaction(s): Coordination problem   Hydralazine  Hcl     Other Reaction(s): Unknown   Lisinopril Cough    Other reaction(s): Cough   Pregabalin     Other Reaction(s): Dizziness  Other reaction(s): Dizziness   Tramadol     Other Reaction(s): Other (See Comments), Unknown   Valsartan     Other Reaction(s): Cough  Other reaction(s): Cough   Verapamil     Other Reaction(s): Dizziness  Other reaction(s): Dizziness   Guaifenesin Rash   Sulfa Antibiotics Rash    Current Outpatient Medications  Medication Sig Dispense Refill   ascorbic acid (VITAMIN C) 500 MG tablet Take  500 mg by mouth 2 (two) times daily.     cholecalciferol (VITAMIN D3) 25 MCG (1000 UNIT) tablet Take 1,000 Units by mouth daily.     Probiotic Product (PROBIOTIC BLEND PO) Take by mouth. (Patient not taking: Reported on 02/05/2024)     No current facility-administered medications for this visit.    OBJECTIVE: Vitals:   02/05/24 1303  BP: 135/86  Pulse: 72  Resp: 16  Temp: 97.7 F (36.5 C)  SpO2: 98%     Body mass index is 28.41 kg/m.    ECOG FS:0 - Asymptomatic  General:  Well-developed, well-nourished, no acute distress. Eyes: Pink conjunctiva, anicteric sclera. HEENT: Normocephalic, moist mucous membranes. Lungs: No audible wheezing or coughing. Heart: Regular rate and rhythm. Abdomen: Soft, nontender, no obvious distention. Musculoskeletal: No edema, cyanosis, or clubbing. Neuro: Alert, answering all questions appropriately. Cranial nerves grossly intact. Skin: No rashes or petechiae noted. Psych: Normal affect.  LAB RESULTS:  Lab Results  Component Value Date   NA 141 01/29/2024   K 3.9 01/29/2024   CL 108 01/29/2024   CO2 26 01/29/2024   GLUCOSE 111 (H) 01/29/2024   BUN 28 (H) 01/29/2024   CREATININE 1.00 01/29/2024   CALCIUM  9.2 01/29/2024   PROT 6.4 (L) 01/29/2024   ALBUMIN  3.6 01/29/2024   AST 23 01/29/2024   ALT 24 01/29/2024   ALKPHOS 60 01/29/2024   BILITOT 0.9 01/29/2024   GFRNONAA >60 01/29/2024   GFRAA >60 07/17/2013    Lab Results  Component Value Date   WBC 7.5 01/29/2024   NEUTROABS 5.0 01/29/2024   HGB 14.4 01/29/2024   HCT 44.7 01/29/2024   MCV 93.3 01/29/2024   PLT 162 01/29/2024     STUDIES: CT ABDOMEN PELVIS W CONTRAST Result Date: 01/29/2024 EXAMINATION: CT ABDOMEN PELVIS W CONTRAST CLINICAL INDICATION: Male, 89 years old. Adenocarcinoma of colon TECHNIQUE: Axial CT of the abdomen and pelvis with 100 cc Omnipaque  300 intravenous contrast. Multiplanar reformations provided. Unless otherwise specified, incidental thyroid, adrenal, renal lesions do not require dedicated imaging follow up. Additionally, any mentioned pulmonary nodules do not require dedicated imaging follow-up based on the Fleischner guidelines unless otherwise specified. Coronary calcifications are not identified unless otherwise specified. COMPARISON: 08/03/2023 FINDINGS: There are minimal atelectatic changes within the lung bases. The heart is normal in size. There are coronary calcifications. Small hiatal hernia noted. The liver contains a similar  2.4 cm low-attenuation lesion in segment 7. The gallbladder is normal. The spleen is normal. The pancreas is normal. The adrenals are normal. The right kidney is normal. Left renal cortical scarring noted. 4.6 cm infrarenal abdominal aortic aneurysm appears marginally increased in size compared to prior previously measuring 4.4 cm when measured in the same fashion on prior. Scattered atherosclerotic changes are present. Small bladder stone noted. The prostate is enlarged. There is colonic diverticulosis. Right hemicolectomy noted. Large and small bowel loops are otherwise within normal limits. No free fluid or lymphadenopathy. There are degenerative changes of the spine and bony pelvis. IMPRESSION: Stable indeterminate hepatic lesion. 4.6 cm infrarenal abdominal aortic aneurysm. Recommend follow-up CT in one year and referral to a vascular specialist. Other findings as above. DOSE REDUCTION: This exam was performed according to our departmental dose-optimization program which includes automated exposure control, adjustment of the mA and/or kV according to patient size and/or use of iterative reconstruction technique. 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  Pathology and imaging reviewed independently confirming stage of disease. Given patient's stage of disease, adjuvant treatment with FOLFOX x12 for 6 months was recommended, but patient declined any chemotherapy given his advanced age.  CT scan results from January 29, 2024 reviewed independently and reported as above with no obvious evidence of recurrent or progressive disease.  CEA also continues to be within normal limits at 1.5.  No intervention is needed at this time.  Return to clinic in 6 months with repeat imaging and  further evaluation.    Liver lesion: No substantial change since MRI on April 03, 2023.  Likely benign.  Repeat imaging as above.  Anemia: Resolved.  Patient expressed understanding and was in agreement with this plan. He also understands that He can call clinic at any time with any questions, concerns, or complaints.    Cancer Staging  Adenocarcinoma of colon [facility]) Staging form: Colon and Rectum, AJCC 8th Edition - Clinical: Stage IIIB (cT4a, cN1a, cM0) - Signed by Shellie Dials, MD on 04/14/2023 Total positive nodes: 1   Shellie Dials, MD   02/05/2024 1:45 PM

## 2024-03-27 ENCOUNTER — Ambulatory Visit (INDEPENDENT_AMBULATORY_CARE_PROVIDER_SITE_OTHER): Admitting: Surgery

## 2024-03-27 ENCOUNTER — Encounter: Payer: Self-pay | Admitting: Surgery

## 2024-03-27 VITALS — BP 175/95 | HR 82 | Temp 97.7°F | Ht 70.0 in | Wt 196.8 lb

## 2024-03-27 DIAGNOSIS — M6208 Separation of muscle (nontraumatic), other site: Secondary | ICD-10-CM

## 2024-03-27 DIAGNOSIS — C182 Malignant neoplasm of ascending colon: Secondary | ICD-10-CM | POA: Diagnosis not present

## 2024-03-27 DIAGNOSIS — K432 Incisional hernia without obstruction or gangrene: Secondary | ICD-10-CM | POA: Diagnosis not present

## 2024-03-27 NOTE — Patient Instructions (Signed)
 Exploratory Laparotomy, Adult, Care After After exploratory laparotomy, it's common to have pain, tiredness, bloating, and gas. You may also have no desire to eat food. Follow these instructions at home: Medicines Take over-the-counter and prescription medicines only as told by your health care provider. Take your antibiotics as told by your provider. Do not stop taking your antibiotics even if you start to feel better. Ask your provider if the medicine prescribed to you: Requires you to avoid driving or using machinery. Can cause constipation, or trouble pooping. You may need to take these actions to prevent or treat pooping problems: Drink enough fluid to keep your pee (urine) pale yellow. Take over-the-counter or prescription medicines. Eat foods that are high in fiber, such as beans, whole grains, and fresh fruits and vegetables. Limit foods that are high in fat and processed sugars, such as fried or sweet foods. Incision care  Follow instructions from your provider about how to take care of your incision, or the cut in your belly. Make sure you: Wash your hands with soap and water for at least 20 seconds before and after you change your bandage. If soap and water are not available, use hand sanitizer. Change your bandage as told by your provider. Leave stitches, skin glue, or tape strips in place. These skin closures may need to stay in place for 2 weeks or longer. If tape strip edges start to loosen and curl up, you may trim the loose edges. Do not remove tape strips completely unless your provider tells you to do that. If you were sent home with a drain, follow instructions from your provider about how to care for it. Check the cut in your belly every day for signs of infection. Check for: Redness, swelling, or more pain. Fluid or blood. Warmth. Pus or a bad smell. Activity  Rest as told by your provider. Do not sit for a long time without moving. Get up to take short walks every  1-2 hours. This will improve blood flow and breathing. Ask for help if you feel weak or unsteady. You may have to avoid lifting. Ask your provider how much you can safely lift. Return to your normal activities as told by your provider. Ask your provider what activities are safe for you. General instructions Do not use any products that contain nicotine or tobacco. These products include cigarettes, chewing tobacco, and vaping devices, such as e-cigarettes. These can delay incision healing after surgery. If you need help quitting, ask your provider. Do not take baths, swim, or use a hot tub until your provider approves. Ask your provider if you may take showers. You may only be allowed to take sponge baths. Keep all follow-up visits. Your provider will check for problems and make sure you are healing. Your provider may give you more instructions. Make sure you know what you can and can't do. Contact a health care provider if: You have a fever or chills. Your pain is getting worse or your pain medicine is not helping. You vomit or feel like you may vomit. You have watery poop. You have trouble pooping or have not had a bowel movement for more than 3 days. You have signs of infection around the cut that was made in your belly. Get help right away if: The cut in your belly opens up. You have warmth, tenderness, or swelling in your calf. You have trouble breathing. You have chest pain. These symptoms may be an emergency. Get help right away. Call 911. Do  not wait to see if the symptoms will go away. Do not drive yourself to the hospital. This information is not intended to replace advice given to you by your health care provider. Make sure you discuss any questions you have with your health care provider. Document Revised: 10/26/2022 Document Reviewed: 10/26/2022 Elsevier Patient Education  2024 [employer].

## 2024-03-27 NOTE — Progress Notes (Signed)
 03/27/2024  History of Present Illness: Don Arias is a 89 y.o. male s/p exploratory laparotomy and right colectomy for obstructing right colon mass causing perforation on 03/18/23.  He presents for follow up.  He reports that he's been doing well.  Denies any abdominal pain, nausea, vomiting, constipation, or blood in his stool.  He is back to his usual activities and mows his own lawn.  He had a CT scan of abdomen and pelvis on 01/29/24 which did not have any acute findings and the liver mass noted on initial presentation in 2024 is stable in size.  He had declined chemotherapy as part of his treatment plan and on his last visit with me, he had also declined pursuing colonoscopy at the 1 year mark.  He does notice a bulging area in his midline, but denies any pain with this.  Past Medical History: Past Medical History:  Diagnosis Date   Cancer (HCC)    skin   Hypertension      Past Surgical History: Past Surgical History:  Procedure Laterality Date   CATARACT EXTRACTION     LAPAROTOMY N/A 03/18/2023   Procedure: EXPLORATORY LAPAROTOMY  POSS. RIGHT COLECTOMY;  Surgeon: Desiderio Schanz, MD;  Location: [facility] ORS;  Service: General;  Laterality: N/A;   PROSTATE SURGERY     RECONSTRUCTION OF EYELID     SKIN CANCER EXCISION      Home Medications: Prior to Admission medications   Medication Sig Start Date End Date Taking? Authorizing Provider  ascorbic acid (VITAMIN C) 500 MG tablet Take 500 mg by mouth 2 (two) times daily.   Yes [provider]  cholecalciferol (VITAMIN D3) 25 MCG (1000 UNIT) tablet Take 1,000 Units by mouth daily.   Yes [provider]  Probiotic Product (PROBIOTIC BLEND PO) Take by mouth.   Yes [provider]    Allergies: Allergies  Allergen Reactions   Nitroglycerin Other (See Comments)    Cardiac Arrest   Gabapentin     Other Reaction(s): Coordination problem   Hydralazine  Hcl     Other Reaction(s): Unknown   Lisinopril Cough     Other reaction(s): Cough   Pregabalin     Other Reaction(s): Dizziness  Other reaction(s): Dizziness   Tramadol     Other Reaction(s): Other (See Comments), Unknown   Valsartan     Other Reaction(s): Cough  Other reaction(s): Cough   Verapamil     Other Reaction(s): Dizziness  Other reaction(s): Dizziness   Guaifenesin Rash   Sulfa Antibiotics Rash    Review of Systems: Review of Systems  Constitutional:  Negative for chills, fever and weight loss.  Respiratory:  Negative for shortness of breath.   Cardiovascular:  Negative for chest pain.  Gastrointestinal:  Negative for abdominal pain, blood in stool, constipation, nausea and vomiting.    Physical Exam BP (!) 175/95   Pulse 82   Temp 97.7 F (36.5 C) (Oral)   Ht 5' 10 (1.778 m)   Wt 196 lb 12.8 oz (89.3 kg)   SpO2 99%   BMI 28.24 kg/m  CONSTITUTIONAL: No acute distress HEENT:  Normocephalic, atraumatic, extraocular motion intact. RESPIRATORY:  Normal respiratory effort without pathologic use of accessory muscles. CARDIOVASCULAR: Regular rhythm and rate GI: The abdomen is soft, non-distended, non-tender to palpation.  Patient's midline incision is well healed.  He does have diastasis recti which was present before his surgery, but within the midline, also has a 3 x 5.5 cm incisional hernia in the upper portion  of his incision.  This is easily reducible without any tenderness. NEUROLOGIC:  Motor and sensation is grossly normal.  Cranial nerves are grossly intact. PSYCH:  Alert and oriented to person, place and time. Affect is normal.  Labs/Imaging: CT abdomen/pelvis on 01/29/24: FINDINGS: There are minimal atelectatic changes within the lung bases. The heart is normal in size. There are coronary calcifications. Small hiatal hernia noted. The liver contains a similar 2.4 cm low-attenuation lesion in segment 7.   The gallbladder is normal. The spleen is normal. The pancreas is normal.  The adrenals are normal. The right  kidney is normal. Left renal cortical scarring noted. 4.6 cm infrarenal abdominal aortic aneurysm appears marginally increased in size compared to prior previously measuring 4.4 cm when measured in the same fashion on prior.   Scattered atherosclerotic changes are present. Small bladder stone noted.  The prostate is enlarged. There is colonic diverticulosis. Right  hemicolectomy noted.   Large and small bowel loops are otherwise within normal limits. No free fluid or lymphadenopathy. There are degenerative changes of the spine and bony pelvis.   IMPRESSION: Stable indeterminate hepatic lesion.   4.6 cm infrarenal abdominal aortic aneurysm. Recommend follow-up CT in one year and referral to a vascular specialist.   Other findings as above.  Assessment and Plan: This is a 89 y.o. male s/p exlap and right colectomy  --The patient is doing well a year after his surgery.  He has returned to his baseline activities and is back to his usual self.  Denies any constipation and has not needed any stool softeners since his surgery.  Discussed with him that likely the reason for him needing stool softeners and laxatives in the past was due to the mass growing and decreasing the size of the intestinal lumen.   --On exam, he does have an incisional hernia in the setting of diastasis recti.  Comparing to his initial admission CT scan, his diastasis was already present and appears stable, with the exception of the hernia defect.  It is easily reducible and does not cause any issues for now.  Discussed with him that for now we can observe this hernia.  Recommended that he wear an abdominal binder, particularly when he's doing any exertion, to decrease the tension on the hernia.  If the hernia were to worsen, we can readdress this and possibly discuss surgical repair. --Confirmed again with the patient that he does not want a colonoscopy.  His CT scan does not show any evidence of disease recurrence and his liver  mass noted on initial CT scan and MRI has been stable. --Follow up in 6 months.  I spent 30 minutes dedicated to the care of this patient on the date of this encounter to include pre-visit review of records, face-to-face time with the patient discussing diagnosis and management, and any post-visit coordination of care.   Aloysius Sheree Plant, MD [facility]

## 2024-04-29 ENCOUNTER — Telehealth: Payer: Self-pay | Admitting: Oncology

## 2024-04-29 NOTE — Telephone Encounter (Signed)
 The provider will be out of the office the week of christmas. I called the pt, no answer so I left a vm regarding the new appt date/time and I'm also sending out an appt reminder today.

## 2024-08-06 ENCOUNTER — Inpatient Hospital Stay: Attending: Oncology

## 2024-08-06 ENCOUNTER — Ambulatory Visit: Admission: RE | Admit: 2024-08-06 | Discharge: 2024-08-06 | Attending: Oncology | Admitting: Oncology

## 2024-08-06 DIAGNOSIS — K769 Liver disease, unspecified: Secondary | ICD-10-CM | POA: Diagnosis not present

## 2024-08-06 DIAGNOSIS — C189 Malignant neoplasm of colon, unspecified: Secondary | ICD-10-CM

## 2024-08-06 DIAGNOSIS — Z9049 Acquired absence of other specified parts of digestive tract: Secondary | ICD-10-CM | POA: Insufficient documentation

## 2024-08-06 DIAGNOSIS — Z85038 Personal history of other malignant neoplasm of large intestine: Secondary | ICD-10-CM | POA: Diagnosis present

## 2024-08-06 LAB — CBC WITH DIFFERENTIAL/PLATELET
Abs Immature Granulocytes: 0.02 K/uL (ref 0.00–0.07)
Basophils Absolute: 0 K/uL (ref 0.0–0.1)
Basophils Relative: 0 %
Eosinophils Absolute: 0.1 K/uL (ref 0.0–0.5)
Eosinophils Relative: 1 %
HCT: 44.2 % (ref 39.0–52.0)
Hemoglobin: 14.8 g/dL (ref 13.0–17.0)
Immature Granulocytes: 0 %
Lymphocytes Relative: 17 %
Lymphs Abs: 1.2 K/uL (ref 0.7–4.0)
MCH: 32.4 pg (ref 26.0–34.0)
MCHC: 33.5 g/dL (ref 30.0–36.0)
MCV: 96.7 fL (ref 80.0–100.0)
Monocytes Absolute: 0.4 K/uL (ref 0.1–1.0)
Monocytes Relative: 6 %
Neutro Abs: 5.5 K/uL (ref 1.7–7.7)
Neutrophils Relative %: 76 %
Platelets: 159 K/uL (ref 150–400)
RBC: 4.57 MIL/uL (ref 4.22–5.81)
RDW: 13.1 % (ref 11.5–15.5)
WBC: 7.2 K/uL (ref 4.0–10.5)
nRBC: 0 % (ref 0.0–0.2)

## 2024-08-06 LAB — CMP (CANCER CENTER ONLY)
ALT: 14 U/L (ref 0–44)
AST: 18 U/L (ref 15–41)
Albumin: 3.9 g/dL (ref 3.5–5.0)
Alkaline Phosphatase: 89 U/L (ref 38–126)
Anion gap: 10 (ref 5–15)
BUN: 25 mg/dL — ABNORMAL HIGH (ref 8–23)
CO2: 27 mmol/L (ref 22–32)
Calcium: 9.9 mg/dL (ref 8.9–10.3)
Chloride: 106 mmol/L (ref 98–111)
Creatinine: 1.11 mg/dL (ref 0.61–1.24)
GFR, Estimated: >60 mL/min (ref >60)
Glucose, Bld: 140 mg/dL — ABNORMAL HIGH (ref 70–99)
Potassium: 4.3 mmol/L (ref 3.5–5.1)
Sodium: 143 mmol/L (ref 135–145)
Total Bilirubin: 0.8 mg/dL (ref 0.0–1.2)
Total Protein: 6.3 g/dL — ABNORMAL LOW (ref 6.5–8.1)

## 2024-08-06 MED ORDER — IOHEXOL 300 MG/ML  SOLN
100.0000 mL | Freq: Once | INTRAMUSCULAR | Status: AC | PRN
  Administered 2024-08-06: 14:00:00 100 mL via INTRAVENOUS

## 2024-08-07 LAB — CEA: CEA: 1.2 ng/mL (ref 0.0–4.7)

## 2024-08-13 ENCOUNTER — Ambulatory Visit: Admitting: Oncology

## 2024-08-19 ENCOUNTER — Encounter: Payer: Self-pay | Admitting: Oncology

## 2024-08-19 ENCOUNTER — Inpatient Hospital Stay: Admitting: Oncology

## 2024-08-19 VITALS — BP 142/90 | HR 89 | Temp 96.2°F | Resp 18 | Ht 70.0 in | Wt 197.0 lb

## 2024-08-19 DIAGNOSIS — C189 Malignant neoplasm of colon, unspecified: Secondary | ICD-10-CM

## 2024-08-19 DIAGNOSIS — Z85038 Personal history of other malignant neoplasm of large intestine: Secondary | ICD-10-CM | POA: Diagnosis not present

## 2024-08-19 NOTE — Progress Notes (Signed)
 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  INTERVAL HISTORY: Patient returns to clinic today for routine 59-month evaluation and discussion of his imaging and laboratory results.  He currently feels well and is asymptomatic.  He has no neurologic complaints.  He denies any recent fevers.  He has a good appetite and denies weight loss.  He has no chest pain, shortness of breath, cough, or hemoptysis.  He denies any nausea, vomiting, constipation, or diarrhea.  He has no abdominal pain.  He has noticed no change in his bowel movements.  He denies any melena or hematochezia.  He has no urinary complaints.  Patient offers no specific complaints today.  REVIEW OF SYSTEMS:   Review of Systems  Constitutional: Negative.  Negative for fever, malaise/fatigue and weight loss.  Respiratory: Negative.  Negative for cough, hemoptysis and shortness of breath.   Cardiovascular: Negative.  Negative for chest pain and leg swelling.  Gastrointestinal: Negative.  Negative for abdominal pain, blood in stool, constipation, diarrhea, melena, nausea and vomiting.  Genitourinary: Negative.  Negative for dysuria.  Musculoskeletal: Negative.  Negative for back pain.  Skin: Negative.  Negative for rash.  Neurological: Negative.  Negative for dizziness, focal weakness, weakness and headaches.  Psychiatric/Behavioral: Negative.  The patient is not nervous/anxious.     As per HPI. Otherwise, a complete review of systems is negative.  PAST MEDICAL HISTORY: Past Medical History:  Diagnosis Date   Cancer (HCC)    skin   Hypertension     PAST SURGICAL  HISTORY: Past Surgical History:  Procedure Laterality Date   CATARACT EXTRACTION     LAPAROTOMY N/A 03/18/2023   Procedure: EXPLORATORY LAPAROTOMY  POSS. RIGHT COLECTOMY;  Surgeon: Desiderio Schanz, MD;  Location: [facility] ORS;  Service: General;  Laterality: N/A;   PROSTATE SURGERY     RECONSTRUCTION OF EYELID     SKIN CANCER EXCISION      FAMILY HISTORY: Family History  Problem Relation Age of Onset   Cancer Mother        Lung    ADVANCED DIRECTIVES (Y/N):  N  HEALTH MAINTENANCE: Social History   Tobacco Use   Smoking status: Former    Types: Cigarettes    Passive exposure: Past   Smokeless tobacco: Former  Building Services Engineer status: Never Used  Substance Use Topics   Alcohol use: Not Currently   Drug use: Never     Colonoscopy:  PAP:  Bone density:  Lipid panel:  Allergies  Allergen Reactions   Nitroglycerin Other (See Comments)    Cardiac Arrest   Gabapentin     Other Reaction(s): Coordination problem   Hydralazine  Hcl     Other Reaction(s): Unknown   Lisinopril Cough    Other reaction(s): Cough   Pregabalin     Other Reaction(s): Dizziness  Other reaction(s): Dizziness   Tramadol     Other Reaction(s): Other (See Comments), Unknown   Valsartan     Other Reaction(s): Cough  Other reaction(s): Cough   Verapamil     Other Reaction(s): Dizziness  Other reaction(s): Dizziness   Guaifenesin Rash   Sulfa Antibiotics Rash    Current Outpatient Medications  Medication Sig  Dispense Refill   ascorbic acid (VITAMIN C) 500 MG tablet Take 500 mg by mouth 2 (two) times daily.     cholecalciferol (VITAMIN D3) 25 MCG (1000 UNIT) tablet Take 1,000 Units by mouth daily.     Probiotic Product (PROBIOTIC BLEND PO) Take by mouth.     No current facility-administered medications for this visit.    OBJECTIVE: Vitals:   08/19/24 1302 08/19/24 1305  BP: (!) 158/126 (!) 142/90  Pulse: 89   Resp: 18   Temp: (!) 96.2 F (35.7 C)   SpO2: 99%      Body mass index  is 28.27 kg/m.    ECOG FS:0 - Asymptomatic  General: Well-developed, well-nourished, no acute distress. Eyes: Pink conjunctiva, anicteric sclera. HEENT: Normocephalic, moist mucous membranes. Lungs: No audible wheezing or coughing. Heart: Regular rate and rhythm. Abdomen: Soft, nontender, no obvious distention. Musculoskeletal: No edema, cyanosis, or clubbing. Neuro: Alert, answering all questions appropriately. Cranial nerves grossly intact. Skin: No rashes or petechiae noted. Psych: Normal affect.  LAB RESULTS:  Lab Results  Component Value Date   NA 143 08/06/2024   K 4.3 08/06/2024   CL 106 08/06/2024   CO2 27 08/06/2024   GLUCOSE 140 (H) 08/06/2024   BUN 25 (H) 08/06/2024   CREATININE 1.11 08/06/2024   CALCIUM  9.9 08/06/2024   PROT 6.3 (L) 08/06/2024   ALBUMIN  3.9 08/06/2024   AST 18 08/06/2024   ALT 14 08/06/2024   ALKPHOS 89 08/06/2024   BILITOT 0.8 08/06/2024   GFRNONAA >60 08/06/2024   GFRAA >60 07/17/2013    Lab Results  Component Value Date   WBC 7.2 08/06/2024   NEUTROABS 5.5 08/06/2024   HGB 14.8 08/06/2024   HCT 44.2 08/06/2024   MCV 96.7 08/06/2024   PLT 159 08/06/2024     STUDIES: CT ABDOMEN PELVIS W CONTRAST Result Date: 08/19/2024 EXAM: CT ABDOMEN AND PELVIS WITH CONTRAST 08/06/2024 02:02:19 PM TECHNIQUE: CT of the abdomen and pelvis was performed with the administration of 100 mL iohexol  (OMNIPAQUE ) 300 MG/ML solution. Multiplanar reformatted images are provided for review. Automated exposure control, iterative reconstruction, and/or weight-based adjustment of the mA/kV was utilized to reduce the radiation dose to as low as reasonably achievable. COMPARISON: CT 01/29/2024, CT 03/18/2023, MRI abdomen 04/03/2023. CLINICAL HISTORY: follow up colon cancer FINDINGS: LOWER CHEST: Moderate calcification in the region of the aortic valve. Suggestion of calcification of the right coronary and lateral circumflex coronary arteries. Visualized lung bases are  unremarkable. LIVER: Stable 2.2 cm mass in the right lobe of the liver, previously evaluated by MRI and thought to be indeterminate. No new liver masses. GALLBLADDER AND BILE DUCTS: Gallbladder is unremarkable. No biliary ductal dilatation. SPLEEN: No acute abnormality. PANCREAS: No acute abnormality. ADRENAL GLANDS: No acute abnormality. KIDNEYS, URETERS AND BLADDER: Stable renal cortical scarring over the mid to upper pole left kidney. No stones in the kidneys or ureters. No hydronephrosis. No perinephric or periureteral stranding. 1.3 cm dependent stone within the bladder. GI AND BOWEL: Stomach demonstrates no acute abnormality. Significant diverticulosis of the colon, most prominent of the sigmoid colon where there is very subtle stranding of the pericolonic fat. These findings may be within normal limits, although could be seen in very mild acute diverticulitis. Evidence of previous partial colectomy with suture lines in the region of the transverse colon just right of midline. 4 cm segment of persistent narrowing on the venous phase and delayed images involving the transverse colon in the midline to just left of midline,  not seen previously. This may be due to peristalsis versus mass and is indeterminate. There is no bowel obstruction. PERITONEUM AND RETROPERITONEUM: No ascites. No free air. VASCULATURE: Nonobstructed plaque of the descending thoracic aorta. Mild calcified plaque of the abdominal aorta. 4.3 x 4.1 cm distal abdominal aortic aneurysm just above the bifurcation with moderate mural thrombus without significant change. LYMPH NODES: No significant adenopathy. REPRODUCTIVE ORGANS: Stable prostatic enlargement. BONES AND SOFT TISSUES: No concerning osseous abnormality. Bones are otherwise unchanged. No focal soft tissue abnormality. IMPRESSION: 1. New 4 cm segment of persistent narrowing involving the transverse colon in the midline to just left of midline, indeterminate and likely due to peristalsis,  although mass is possible . Recommend attention on follow up versus barium enema or colonoscopy for further evaluation. 2. Stable 4.3 x 4.1 cm distal abdominal aortic aneurysm just above the bifurcation with moderate mural thrombus. Recommend followup by US  in 1 year. This recommendation follows ACR consensus guidelines: White Paper of the ACR Incidental Findings Committee II on Vascular Findings. J Am Coll Radiol 2013; 10:789-794. 3. Significant diverticulosis of the colon, most prominent in the sigmoid colon, with very subtle pericolonic fat stranding, possibly representing very mild acute diverticulitis. 4. Stable 2.2 cm mass in the right lobe of the liver, previously evaluated by MRI and thought to be indeterminate. No new liver masses. 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  Pathology and imaging reviewed independently confirming stage of disease. Given patient's stage of disease, adjuvant treatment with FOLFOX x12 for 6 months was recommended, but patient declined any chemotherapy given his advanced age.  His most recent imaging on 03/19/2024 reviewed independently and report as above with no obvious evidence of recurrent or progressive disease.  4 cm segment in transverse colon is likely peristalsis.  CEA continues to be within normal limits.  No intervention is needed at this time.  To clinic in 6 months with repeat imaging, laboratory work, and further evaluation.   Liver lesion: Appears to be benign.  No substantial change since MRI on April 03, 2023.  Imaging as above with no significant change.    Patient expressed understanding and was in agreement with this plan. He also understands that He can call clinic at any time with any questions, concerns, or  complaints.    Cancer Staging  Adenocarcinoma of colon [facility]) Staging form: Colon and Rectum, AJCC 8th Edition - Clinical: Stage IIIB (cT4a, cN1a, cM0) - Signed by Jacobo Evalene PARAS, MD on 04/14/2023 Total positive nodes: 1   Evalene PARAS Jacobo, MD   08/19/2024 1:24 PM     "

## 2024-08-20 ENCOUNTER — Encounter: Payer: Self-pay | Admitting: Oncology

## 2024-08-20 ENCOUNTER — Telehealth: Payer: Self-pay | Admitting: Oncology

## 2024-08-20 NOTE — Telephone Encounter (Signed)
 Called pt daughter to sched CT - pt daughter confirmed date/time/location - pt daughter requested appt reminder via mail - LH

## 2025-02-17 ENCOUNTER — Inpatient Hospital Stay

## 2025-02-17 ENCOUNTER — Other Ambulatory Visit

## 2025-02-24 ENCOUNTER — Inpatient Hospital Stay: Admitting: Oncology
